# Patient Record
Sex: Female | Born: 1945 | Race: Black or African American | Hispanic: No | Smoking: Former smoker
Health system: Southern US, Community
[De-identification: ages and names within clinical notes are randomized; demographics above are authoritative.]

## PROBLEM LIST (undated history)

## (undated) DIAGNOSIS — M1990 Unspecified osteoarthritis, unspecified site: Secondary | ICD-10-CM

## (undated) DIAGNOSIS — E7800 Pure hypercholesterolemia, unspecified: Secondary | ICD-10-CM

## (undated) DIAGNOSIS — I10 Essential (primary) hypertension: Secondary | ICD-10-CM

## (undated) HISTORY — PX: ABDOMINAL HYSTERECTOMY: SHX81

---

## 2001-01-13 ENCOUNTER — Emergency Department (HOSPITAL_COMMUNITY): Admission: EM | Admit: 2001-01-13 | Discharge: 2001-01-13 | Payer: Self-pay | Admitting: Internal Medicine

## 2002-09-13 ENCOUNTER — Encounter: Payer: Self-pay | Admitting: Emergency Medicine

## 2002-09-13 ENCOUNTER — Emergency Department (HOSPITAL_COMMUNITY): Admission: EM | Admit: 2002-09-13 | Discharge: 2002-09-13 | Payer: Self-pay | Admitting: Emergency Medicine

## 2004-10-18 ENCOUNTER — Ambulatory Visit (HOSPITAL_COMMUNITY): Admission: RE | Admit: 2004-10-18 | Discharge: 2004-10-18 | Payer: Self-pay | Admitting: Internal Medicine

## 2006-05-27 ENCOUNTER — Ambulatory Visit (HOSPITAL_COMMUNITY): Admission: RE | Admit: 2006-05-27 | Discharge: 2006-05-27 | Payer: Self-pay | Admitting: Otolaryngology

## 2006-10-06 ENCOUNTER — Ambulatory Visit (HOSPITAL_COMMUNITY): Admission: RE | Admit: 2006-10-06 | Discharge: 2006-10-06 | Payer: Self-pay | Admitting: Internal Medicine

## 2007-10-26 ENCOUNTER — Ambulatory Visit (HOSPITAL_COMMUNITY): Admission: RE | Admit: 2007-10-26 | Discharge: 2007-10-26 | Payer: Self-pay | Admitting: Internal Medicine

## 2009-07-24 ENCOUNTER — Ambulatory Visit (HOSPITAL_COMMUNITY): Admission: RE | Admit: 2009-07-24 | Discharge: 2009-07-24 | Payer: Self-pay | Admitting: Internal Medicine

## 2014-09-25 DIAGNOSIS — E119 Type 2 diabetes mellitus without complications: Secondary | ICD-10-CM | POA: Diagnosis not present

## 2014-09-25 DIAGNOSIS — E782 Mixed hyperlipidemia: Secondary | ICD-10-CM | POA: Diagnosis not present

## 2014-09-25 DIAGNOSIS — I10 Essential (primary) hypertension: Secondary | ICD-10-CM | POA: Diagnosis not present

## 2014-09-25 DIAGNOSIS — N189 Chronic kidney disease, unspecified: Secondary | ICD-10-CM | POA: Diagnosis not present

## 2014-11-08 DIAGNOSIS — J4520 Mild intermittent asthma, uncomplicated: Secondary | ICD-10-CM | POA: Diagnosis not present

## 2014-11-08 DIAGNOSIS — Z1389 Encounter for screening for other disorder: Secondary | ICD-10-CM | POA: Diagnosis not present

## 2014-11-08 DIAGNOSIS — E119 Type 2 diabetes mellitus without complications: Secondary | ICD-10-CM | POA: Diagnosis not present

## 2014-11-08 DIAGNOSIS — Z0001 Encounter for general adult medical examination with abnormal findings: Secondary | ICD-10-CM | POA: Diagnosis not present

## 2014-11-08 DIAGNOSIS — E782 Mixed hyperlipidemia: Secondary | ICD-10-CM | POA: Diagnosis not present

## 2014-11-08 DIAGNOSIS — E870 Hyperosmolality and hypernatremia: Secondary | ICD-10-CM | POA: Diagnosis not present

## 2014-11-08 DIAGNOSIS — I10 Essential (primary) hypertension: Secondary | ICD-10-CM | POA: Diagnosis not present

## 2014-11-16 DIAGNOSIS — Z1231 Encounter for screening mammogram for malignant neoplasm of breast: Secondary | ICD-10-CM | POA: Diagnosis not present

## 2015-01-01 DIAGNOSIS — J4520 Mild intermittent asthma, uncomplicated: Secondary | ICD-10-CM | POA: Diagnosis not present

## 2015-01-01 DIAGNOSIS — E119 Type 2 diabetes mellitus without complications: Secondary | ICD-10-CM | POA: Diagnosis not present

## 2015-01-01 DIAGNOSIS — E782 Mixed hyperlipidemia: Secondary | ICD-10-CM | POA: Diagnosis not present

## 2015-01-01 DIAGNOSIS — M5416 Radiculopathy, lumbar region: Secondary | ICD-10-CM | POA: Diagnosis not present

## 2015-01-01 DIAGNOSIS — N189 Chronic kidney disease, unspecified: Secondary | ICD-10-CM | POA: Diagnosis not present

## 2015-01-01 DIAGNOSIS — I10 Essential (primary) hypertension: Secondary | ICD-10-CM | POA: Diagnosis not present

## 2015-01-05 DIAGNOSIS — M545 Low back pain: Secondary | ICD-10-CM | POA: Diagnosis not present

## 2015-01-05 DIAGNOSIS — M5410 Radiculopathy, site unspecified: Secondary | ICD-10-CM | POA: Diagnosis not present

## 2015-01-05 DIAGNOSIS — M6281 Muscle weakness (generalized): Secondary | ICD-10-CM | POA: Diagnosis not present

## 2015-01-05 DIAGNOSIS — M25552 Pain in left hip: Secondary | ICD-10-CM | POA: Diagnosis not present

## 2015-01-19 DIAGNOSIS — M5410 Radiculopathy, site unspecified: Secondary | ICD-10-CM | POA: Diagnosis not present

## 2015-01-19 DIAGNOSIS — M6281 Muscle weakness (generalized): Secondary | ICD-10-CM | POA: Diagnosis not present

## 2015-01-19 DIAGNOSIS — M545 Low back pain: Secondary | ICD-10-CM | POA: Diagnosis not present

## 2015-01-24 DIAGNOSIS — H5200 Hypermetropia, unspecified eye: Secondary | ICD-10-CM | POA: Diagnosis not present

## 2015-01-24 DIAGNOSIS — H5210 Myopia, unspecified eye: Secondary | ICD-10-CM | POA: Diagnosis not present

## 2015-01-24 DIAGNOSIS — H18419 Arcus senilis, unspecified eye: Secondary | ICD-10-CM | POA: Diagnosis not present

## 2015-01-24 DIAGNOSIS — I10 Essential (primary) hypertension: Secondary | ICD-10-CM | POA: Diagnosis not present

## 2015-02-02 DIAGNOSIS — M5410 Radiculopathy, site unspecified: Secondary | ICD-10-CM | POA: Diagnosis not present

## 2015-02-02 DIAGNOSIS — M545 Low back pain: Secondary | ICD-10-CM | POA: Diagnosis not present

## 2015-02-02 DIAGNOSIS — M6281 Muscle weakness (generalized): Secondary | ICD-10-CM | POA: Diagnosis not present

## 2015-02-15 DIAGNOSIS — M6281 Muscle weakness (generalized): Secondary | ICD-10-CM | POA: Diagnosis not present

## 2015-02-15 DIAGNOSIS — M5410 Radiculopathy, site unspecified: Secondary | ICD-10-CM | POA: Diagnosis not present

## 2015-02-15 DIAGNOSIS — M545 Low back pain: Secondary | ICD-10-CM | POA: Diagnosis not present

## 2015-02-15 DIAGNOSIS — M25552 Pain in left hip: Secondary | ICD-10-CM | POA: Diagnosis not present

## 2015-02-26 DIAGNOSIS — E119 Type 2 diabetes mellitus without complications: Secondary | ICD-10-CM | POA: Diagnosis not present

## 2015-02-26 DIAGNOSIS — E782 Mixed hyperlipidemia: Secondary | ICD-10-CM | POA: Diagnosis not present

## 2015-02-26 DIAGNOSIS — J4520 Mild intermittent asthma, uncomplicated: Secondary | ICD-10-CM | POA: Diagnosis not present

## 2015-02-26 DIAGNOSIS — M5416 Radiculopathy, lumbar region: Secondary | ICD-10-CM | POA: Diagnosis not present

## 2015-02-26 DIAGNOSIS — K30 Functional dyspepsia: Secondary | ICD-10-CM | POA: Diagnosis not present

## 2015-02-26 DIAGNOSIS — I10 Essential (primary) hypertension: Secondary | ICD-10-CM | POA: Diagnosis not present

## 2015-02-26 DIAGNOSIS — N189 Chronic kidney disease, unspecified: Secondary | ICD-10-CM | POA: Diagnosis not present

## 2015-03-02 DIAGNOSIS — M545 Low back pain: Secondary | ICD-10-CM | POA: Diagnosis not present

## 2015-03-02 DIAGNOSIS — M5410 Radiculopathy, site unspecified: Secondary | ICD-10-CM | POA: Diagnosis not present

## 2015-03-02 DIAGNOSIS — M6281 Muscle weakness (generalized): Secondary | ICD-10-CM | POA: Diagnosis not present

## 2015-03-02 DIAGNOSIS — M25552 Pain in left hip: Secondary | ICD-10-CM | POA: Diagnosis not present

## 2015-04-06 DIAGNOSIS — M5410 Radiculopathy, site unspecified: Secondary | ICD-10-CM | POA: Diagnosis not present

## 2015-04-06 DIAGNOSIS — M545 Low back pain: Secondary | ICD-10-CM | POA: Diagnosis not present

## 2015-04-06 DIAGNOSIS — M6281 Muscle weakness (generalized): Secondary | ICD-10-CM | POA: Diagnosis not present

## 2015-05-10 DIAGNOSIS — I10 Essential (primary) hypertension: Secondary | ICD-10-CM | POA: Diagnosis not present

## 2015-05-10 DIAGNOSIS — E870 Hyperosmolality and hypernatremia: Secondary | ICD-10-CM | POA: Diagnosis not present

## 2015-05-10 DIAGNOSIS — E782 Mixed hyperlipidemia: Secondary | ICD-10-CM | POA: Diagnosis not present

## 2015-05-10 DIAGNOSIS — E119 Type 2 diabetes mellitus without complications: Secondary | ICD-10-CM | POA: Diagnosis not present

## 2015-05-10 DIAGNOSIS — N189 Chronic kidney disease, unspecified: Secondary | ICD-10-CM | POA: Diagnosis not present

## 2015-05-17 DIAGNOSIS — E782 Mixed hyperlipidemia: Secondary | ICD-10-CM | POA: Diagnosis not present

## 2015-05-17 DIAGNOSIS — I10 Essential (primary) hypertension: Secondary | ICD-10-CM | POA: Diagnosis not present

## 2015-05-17 DIAGNOSIS — N183 Chronic kidney disease, stage 3 (moderate): Secondary | ICD-10-CM | POA: Diagnosis not present

## 2015-05-17 DIAGNOSIS — M5416 Radiculopathy, lumbar region: Secondary | ICD-10-CM | POA: Diagnosis not present

## 2015-05-17 DIAGNOSIS — E119 Type 2 diabetes mellitus without complications: Secondary | ICD-10-CM | POA: Diagnosis not present

## 2015-05-17 DIAGNOSIS — J4520 Mild intermittent asthma, uncomplicated: Secondary | ICD-10-CM | POA: Diagnosis not present

## 2015-05-17 DIAGNOSIS — E875 Hyperkalemia: Secondary | ICD-10-CM | POA: Diagnosis not present

## 2015-08-08 DIAGNOSIS — I10 Essential (primary) hypertension: Secondary | ICD-10-CM | POA: Diagnosis not present

## 2015-08-08 DIAGNOSIS — E119 Type 2 diabetes mellitus without complications: Secondary | ICD-10-CM | POA: Diagnosis not present

## 2015-08-08 DIAGNOSIS — E782 Mixed hyperlipidemia: Secondary | ICD-10-CM | POA: Diagnosis not present

## 2015-08-15 DIAGNOSIS — M5416 Radiculopathy, lumbar region: Secondary | ICD-10-CM | POA: Diagnosis not present

## 2015-08-15 DIAGNOSIS — J4520 Mild intermittent asthma, uncomplicated: Secondary | ICD-10-CM | POA: Diagnosis not present

## 2015-08-15 DIAGNOSIS — K30 Functional dyspepsia: Secondary | ICD-10-CM | POA: Diagnosis not present

## 2015-08-15 DIAGNOSIS — E875 Hyperkalemia: Secondary | ICD-10-CM | POA: Diagnosis not present

## 2015-08-15 DIAGNOSIS — N183 Chronic kidney disease, stage 3 (moderate): Secondary | ICD-10-CM | POA: Diagnosis not present

## 2015-08-15 DIAGNOSIS — E119 Type 2 diabetes mellitus without complications: Secondary | ICD-10-CM | POA: Diagnosis not present

## 2015-08-15 DIAGNOSIS — E782 Mixed hyperlipidemia: Secondary | ICD-10-CM | POA: Diagnosis not present

## 2015-08-15 DIAGNOSIS — E870 Hyperosmolality and hypernatremia: Secondary | ICD-10-CM | POA: Diagnosis not present

## 2015-08-15 DIAGNOSIS — I10 Essential (primary) hypertension: Secondary | ICD-10-CM | POA: Diagnosis not present

## 2015-09-20 DIAGNOSIS — I10 Essential (primary) hypertension: Secondary | ICD-10-CM | POA: Diagnosis not present

## 2015-09-20 DIAGNOSIS — E119 Type 2 diabetes mellitus without complications: Secondary | ICD-10-CM | POA: Diagnosis not present

## 2015-09-20 DIAGNOSIS — R05 Cough: Secondary | ICD-10-CM | POA: Diagnosis not present

## 2015-09-20 DIAGNOSIS — J019 Acute sinusitis, unspecified: Secondary | ICD-10-CM | POA: Diagnosis not present

## 2015-09-20 DIAGNOSIS — E782 Mixed hyperlipidemia: Secondary | ICD-10-CM | POA: Diagnosis not present

## 2015-09-20 DIAGNOSIS — N183 Chronic kidney disease, stage 3 (moderate): Secondary | ICD-10-CM | POA: Diagnosis not present

## 2016-02-15 DIAGNOSIS — E782 Mixed hyperlipidemia: Secondary | ICD-10-CM | POA: Diagnosis not present

## 2016-02-15 DIAGNOSIS — E870 Hyperosmolality and hypernatremia: Secondary | ICD-10-CM | POA: Diagnosis not present

## 2016-02-15 DIAGNOSIS — E119 Type 2 diabetes mellitus without complications: Secondary | ICD-10-CM | POA: Diagnosis not present

## 2016-02-15 DIAGNOSIS — E875 Hyperkalemia: Secondary | ICD-10-CM | POA: Diagnosis not present

## 2016-02-15 DIAGNOSIS — N183 Chronic kidney disease, stage 3 (moderate): Secondary | ICD-10-CM | POA: Diagnosis not present

## 2016-02-15 DIAGNOSIS — I10 Essential (primary) hypertension: Secondary | ICD-10-CM | POA: Diagnosis not present

## 2016-02-18 DIAGNOSIS — N183 Chronic kidney disease, stage 3 (moderate): Secondary | ICD-10-CM | POA: Diagnosis not present

## 2016-02-18 DIAGNOSIS — E875 Hyperkalemia: Secondary | ICD-10-CM | POA: Diagnosis not present

## 2016-02-18 DIAGNOSIS — E782 Mixed hyperlipidemia: Secondary | ICD-10-CM | POA: Diagnosis not present

## 2016-02-18 DIAGNOSIS — J4520 Mild intermittent asthma, uncomplicated: Secondary | ICD-10-CM | POA: Diagnosis not present

## 2016-02-18 DIAGNOSIS — Z0001 Encounter for general adult medical examination with abnormal findings: Secondary | ICD-10-CM | POA: Diagnosis not present

## 2016-02-18 DIAGNOSIS — M5416 Radiculopathy, lumbar region: Secondary | ICD-10-CM | POA: Diagnosis not present

## 2016-02-18 DIAGNOSIS — E119 Type 2 diabetes mellitus without complications: Secondary | ICD-10-CM | POA: Diagnosis not present

## 2016-02-18 DIAGNOSIS — I10 Essential (primary) hypertension: Secondary | ICD-10-CM | POA: Diagnosis not present

## 2016-04-21 DIAGNOSIS — L959 Vasculitis limited to the skin, unspecified: Secondary | ICD-10-CM | POA: Diagnosis not present

## 2016-04-21 DIAGNOSIS — E119 Type 2 diabetes mellitus without complications: Secondary | ICD-10-CM | POA: Diagnosis not present

## 2016-05-10 DIAGNOSIS — R21 Rash and other nonspecific skin eruption: Secondary | ICD-10-CM | POA: Diagnosis not present

## 2016-05-10 DIAGNOSIS — Z6841 Body Mass Index (BMI) 40.0 and over, adult: Secondary | ICD-10-CM | POA: Diagnosis not present

## 2016-06-03 DIAGNOSIS — H5200 Hypermetropia, unspecified eye: Secondary | ICD-10-CM | POA: Diagnosis not present

## 2016-06-03 DIAGNOSIS — Z0100 Encounter for examination of eyes and vision without abnormal findings: Secondary | ICD-10-CM | POA: Diagnosis not present

## 2016-06-03 DIAGNOSIS — H18413 Arcus senilis, bilateral: Secondary | ICD-10-CM | POA: Diagnosis not present

## 2016-06-10 DIAGNOSIS — Z1231 Encounter for screening mammogram for malignant neoplasm of breast: Secondary | ICD-10-CM | POA: Diagnosis not present

## 2016-06-19 DIAGNOSIS — H8110 Benign paroxysmal vertigo, unspecified ear: Secondary | ICD-10-CM | POA: Diagnosis not present

## 2016-06-19 DIAGNOSIS — L501 Idiopathic urticaria: Secondary | ICD-10-CM | POA: Diagnosis not present

## 2016-06-19 DIAGNOSIS — M5416 Radiculopathy, lumbar region: Secondary | ICD-10-CM | POA: Diagnosis not present

## 2016-06-19 DIAGNOSIS — R21 Rash and other nonspecific skin eruption: Secondary | ICD-10-CM | POA: Diagnosis not present

## 2016-06-19 DIAGNOSIS — N183 Chronic kidney disease, stage 3 (moderate): Secondary | ICD-10-CM | POA: Diagnosis not present

## 2016-06-19 DIAGNOSIS — J4520 Mild intermittent asthma, uncomplicated: Secondary | ICD-10-CM | POA: Diagnosis not present

## 2016-06-19 DIAGNOSIS — Z6841 Body Mass Index (BMI) 40.0 and over, adult: Secondary | ICD-10-CM | POA: Diagnosis not present

## 2016-06-19 DIAGNOSIS — E119 Type 2 diabetes mellitus without complications: Secondary | ICD-10-CM | POA: Diagnosis not present

## 2016-07-21 DIAGNOSIS — Z6841 Body Mass Index (BMI) 40.0 and over, adult: Secondary | ICD-10-CM | POA: Diagnosis not present

## 2016-07-21 DIAGNOSIS — M5416 Radiculopathy, lumbar region: Secondary | ICD-10-CM | POA: Diagnosis not present

## 2016-07-21 DIAGNOSIS — N183 Chronic kidney disease, stage 3 (moderate): Secondary | ICD-10-CM | POA: Diagnosis not present

## 2016-07-21 DIAGNOSIS — I10 Essential (primary) hypertension: Secondary | ICD-10-CM | POA: Diagnosis not present

## 2016-07-21 DIAGNOSIS — E119 Type 2 diabetes mellitus without complications: Secondary | ICD-10-CM | POA: Diagnosis not present

## 2016-07-29 ENCOUNTER — Encounter (INDEPENDENT_AMBULATORY_CARE_PROVIDER_SITE_OTHER): Payer: Self-pay

## 2016-07-29 ENCOUNTER — Encounter: Payer: Self-pay | Admitting: Allergy & Immunology

## 2016-07-29 ENCOUNTER — Ambulatory Visit (INDEPENDENT_AMBULATORY_CARE_PROVIDER_SITE_OTHER): Payer: Commercial Managed Care - HMO | Admitting: Allergy & Immunology

## 2016-07-29 VITALS — BP 122/86 | HR 97 | Temp 97.6°F | Ht 61.5 in | Wt 229.6 lb

## 2016-07-29 DIAGNOSIS — L508 Other urticaria: Secondary | ICD-10-CM | POA: Diagnosis not present

## 2016-07-29 DIAGNOSIS — J310 Chronic rhinitis: Secondary | ICD-10-CM | POA: Diagnosis not present

## 2016-07-29 DIAGNOSIS — T781XXD Other adverse food reactions, not elsewhere classified, subsequent encounter: Secondary | ICD-10-CM | POA: Diagnosis not present

## 2016-07-29 DIAGNOSIS — J309 Allergic rhinitis, unspecified: Secondary | ICD-10-CM | POA: Insufficient documentation

## 2016-07-29 DIAGNOSIS — J3089 Other allergic rhinitis: Secondary | ICD-10-CM | POA: Diagnosis not present

## 2016-07-29 DIAGNOSIS — J4520 Mild intermittent asthma, uncomplicated: Secondary | ICD-10-CM

## 2016-07-29 MED ORDER — MONTELUKAST SODIUM 10 MG PO TABS: 10.0000 mg | ORAL_TABLET | Freq: Every day | ORAL | 5 refills | Status: AC

## 2016-07-29 MED ORDER — FLUTICASONE PROPIONATE 50 MCG/ACT NA SUSP: 2.0000 | Freq: Every day | NASAL | 5 refills | Status: AC

## 2016-07-29 NOTE — Progress Notes (Addendum)
NEW PATIENT  Date of Service/Encounter:  07/29/16   Assessment:   Adverse food reaction (tomatoes, chocolate)  Chronic allergic rhinitis  Chronic urticaria   Asthma Reportables:  Severity: intermittent  Risk: low Control: well controlled  Seasonal Influenza Vaccine: yes    Plan/Recommendations:   1. Chronic urticaria - with concern for food trigger (total duration around 8 weeks) - Testing for tomatoes and chocolate was negative, therefore these are unlikely to be related to your hives. - We will send blood work to confirm that the testing was negative.  - We will get some labs to look for serious causes of hives: complete blood count, serum tryptase - There were no red flags in the history, including fevers, joint pains, or permanent skin changes. - Most of the time, we never figure out a cause of hives. - We start treatment with suppressive dosing of antihistamines: Allegra (fexofenadine) 1-2 tablets in the morning and Zyrtec (cetirizine) or Xyzal (levocetirizine) 1-2 tablets at night - If you are continuing to have hives with these medications or you cannot tolerate the side effects, we can discuss starting a monthly injectable medication for hives called Xolair.   2. Chronic rhinitis - Testing today showed: positives to dust mite and cockroach - Start Singulair 36m nightly and Flonase 1-2 sprays per nostril daily. - Avoidance measures discussed.   3. Intermittent asthma - Lung testing was normal today. - Continue with ProAir 4 puffs every 4-6 hours as needed for coughing/wheezing.  - The addition of the Singulair can help with asthma control as well.   4. Return in about 3 months (around 10/29/2016).    Subjective:   Allison Bates a 70y.o. female presenting today for evaluation of  Chief Complaint  Patient presents with  . New Evaluation    C/O hives. Possibly due to tomatoes. Pt states hives go away quickly.   .Marland Kitchen Allison Bates a  history of the following: Patient Active Problem List   Diagnosis Date Noted  . Allergic rhinitis due to allergen 07/29/2016  . Chronic urticaria 07/29/2016  . Mild intermittent asthma, uncomplicated [id number]/[id number]   History obtained from: chart review and patient.  Allison Bates referred by BCurlene Labrum MD.     Allison Bates a 70y.o. female presenting with concerns for food allergies. She reports today that she has had itching. This has started around 1-2 months ago. She had been eating tomatoes every day therefore she thinks this is related to the tomatoes or chocolate. The timing was not necessarily related to tomato ingestion (i.e. within minutes of eating the tomato). She has had urticaria development with the scratching. She stopped eating tomatoes around one month ago and it seems to have improved somewhat. She is using loratadine to treat the itching, which is providing relief. She has had no other systemic symptoms.She tolerates all of the major food allergens without adverse event. However, she does not eat many tree nuts.  Allison Bates have intermittent rhinorrhea. She does have occasional itchy watery eyes and nasal congestion. She takes Benadryl as needed for this. She has never been on a nasal steroid spray or an antihistamine nasal spray. There does not seem to be a seasonal predilection for the symptoms.   Allison Bates have asthma. This was diagnosed around age 70 It seemed to be fairly severe when she was younger with severe impairment of physical activity but it has since subsided in severity. She does  not have any current daily medications for this. She does have an albuterol inhaler to use when it "flies up". She estimates that she needs prednisone around 1-2 times per year. She does not cough at night. She denies ED visits for breathing. She has had spirometry in the past but does not remember any results of this.   Otherwise, there is no history of other  atopic diseases, including asthma, drug allergies, or stinging insect allergies. There is no significant infectious history. Vaccinations are up to date.    Past Medical History: Patient Active Problem List   Diagnosis Date Noted  . Allergic rhinitis due to allergen 07/29/2016  . Chronic urticaria 07/29/2016  . Mild intermittent asthma, uncomplicated 52/04/222    Medication List:    Medication List       Accurate as of 07/29/16 12:24 PM. Always use your most recent med list.          amLODipine 10 MG tablet Commonly known as:  NORVASC   atorvastatin 10 MG tablet Commonly known as:  LIPITOR daily.   valsartan-hydrochlorothiazide 320-25 MG tablet Commonly known as:  DIOVAN-HCT       Birth History: non-contributory. Born at term without complications.   Developmental History: Allison Bates has met all milestones on time. She has required no speech therapy, occupational therapy, or physical therapy.   Past Surgical History: Past Surgical History:  Procedure Laterality Date  . ABDOMINAL HYSTERECTOMY       Family History: Family History  Problem Relation Age of Onset  . Allergic rhinitis Neg Hx   . Angioedema Neg Hx   . Asthma Neg Hx   . Atopy Neg Hx   . Eczema Neg Hx   . Immunodeficiency Neg Hx   . Urticaria Neg Hx      Social History: Allison Bates lives at home with her husband of 85 years. They live in a 47 year old home. There is no mildew allergic problems in the home. They have electric heating and central cooling. There are no animals inside the home but there is one dog outside of the home. There are no dust mite covers on the bed with pillows. She was a previous smoker and around 16 years ago. Allison Bates has had several jobs over the course of her lifetime. She worked in a Clinical cytogeneticist for 37 years and then at [employer] for 12 years. 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   Review of Systems: a 14-point  review of systems is pertinent for what is mentioned in HPI.  Otherwise, all other systems were negative. Constitutional: negative other than that listed in the HPI Eyes: negative other than that listed in the HPI Ears, nose, mouth, throat, and face: negative other than that listed in the HPI Respiratory: negative other than that listed in the HPI Cardiovascular: negative other than that listed in the HPI Gastrointestinal: negative other than that listed in the HPI Genitourinary: negative other than that listed in the HPI Integument: negative other than that listed in the HPI Hematologic: negative other than that listed in the HPI Musculoskeletal: negative other than that listed in the HPI Neurological: negative other than that listed in the HPI Allergy/Immunologic: negative other than that listed in the HPI    Objective:   Blood pressure 122/86, pulse 97, temperature 97.6 F (36.4 C), temperature source Oral, height 5' 1.5" (1.562 m), weight 229 lb 9.6 oz (104.1 kg), SpO2 94 %. Body mass index is 42.68  kg/m.   Physical Exam:  General: Alert, interactive, in no acute distress. Very pleasant and smiling during the exam. HEENT: TMs pearly gray, turbinates markedly edematous and pale (left > right) with clear discharge, post-pharynx erythematous. Neck: Supple without thyromegaly. Adenopathy: no enlarged lymph nodes appreciated in the anterior cervical, occipital, axillary, epitrochlear, inguinal, or popliteal regions Lungs: Clear to auscultation without wheezing, rhonchi or rales. No increased work of breathing. CV: Normal S1/S2, no murmurs. Capillary refill <2 seconds.  Abdomen: Nondistended, nontender. No guarding or rebound tenderness. Bowel sounds faint and present in all fields  Skin: Warm and dry, without lesions or rashes. No dermatographism present. Extremities:  No clubbing, cyanosis or edema. Neuro:   Grossly intact. No focal deficits noted.  Diagnostic studies:    Spirometry: results normal (FEV1: 1.14/76%, FVC: 1.60/88%, FEV1/FVC: 71%).    Spirometry consistent with normal pattern.    Allergy Studies:   Indoor/Outdoor Percutaneous Adult Environmental Panel: positive to dust mites and cockroach with adequate controls  Selected Foods Panel: negative to chocolate and tomato with adequate controls    Salvatore Marvel, MD [place] [facility] [place]

## 2016-07-29 NOTE — Patient Instructions (Addendum)
1. Chronic urticaria - with concern for food trigger - Testing for tomatoes and chocolate was negative, therefore these are unlikely to be related to your hives. - We will send blood work to confirm that the testing was negative.  - We will get some labs to look for serious causes of hives: complete blood count, serum tryptase - Most of the time, we never figure out a cause of hives. - We start treatment with suppressive dosing of antihistamines: Allegra (fexofenadine) 1-2 tablets in the morning and Zyrtec (cetirizine) or Xyzal (levocetirizine) 1-2 tablets at night - If you are continuing to have hives with these medications or you cannot tolerate the side effects, we can discuss starting a monthly injectable medication for hives called Xolair.   2. Chronic rhinitis - Testing today showed: positives to dust mite and cockroach - Start Singulair 10mg  nightly and Flonase 1-2 sprays per nostril daily. - Avoidance measures discussed.   3. Intermittent asthma - Lung testing was normal today. - Continue with ProAir 4 puffs every 4-6 hours as needed for coughing/wheezing.  - The addition of the Singulair can help with asthma control as well.   4. Return in about 3 months (around 10/29/2016).  Please inform us of any Emergency Department visits, hospitalizations, or changes in symptoms. Call us before going to the ED for breathing or allergy symptoms since we might be able to fit you in for a sick visit. Feel free to contact us anytime with any questions, problems, or concerns.  It was a pleasure to meet you today! Have a wonderful Thanksgiving holiday!   Websites that have reliable patient information: 1. American Academy of Asthma, Allergy, and Immunology: www.aaaai.org 2. Food Allergy Research and Education (FARE): foodallergy.org 3. Mothers of Asthmatics: http://www.asthmacommunitynetwork.org 4. American College of Allergy, Asthma, and Immunology: www.acaai.org  Control of House Dust Mite  Allergen    House dust mites play a major role in allergic asthma and rhinitis.  They occur in environments with high humidity wherever human skin, the food for dust mites is found. High levels have been detected in dust obtained from mattresses, pillows, carpets, upholstered furniture, bed covers, clothes and soft toys.  The principal allergen of the house dust mite is found in its feces.  A gram of dust may contain 1,000 mites and 250,000 fecal particles.  Mite antigen is easily measured in the air during house cleaning activities.    1. Encase mattresses, including the box spring, and pillow, in an air tight cover.  Seal the zipper end of the encased mattresses with wide adhesive tape. 2. Wash the bedding in water of 130 degrees Farenheit weekly.  Avoid cotton comforters/quilts and flannel bedding: the most ideal bed covering is the dacron comforter. 3. Remove all upholstered furniture from the bedroom. 4. Remove carpets, carpet padding, rugs, and non-washable window drapes from the bedroom.  Wash drapes weekly or use plastic window coverings. 5. Remove all non-washable stuffed toys from the bedroom.  Wash stuffed toys weekly. 6. Have the room cleaned frequently with a vacuum cleaner and a damp dust-mop.  The patient should not be in a room which is being cleaned and should wait 1 hour after cleaning before going into the room. 7. Close and seal all heating outlets in the bedroom.  Otherwise, the room will become filled with dust-laden air.  An electric heater can be used to heat the room. 8. Reduce indoor humidity to less than 50%.  Do not use a humidifier.  Control of  Cockroach Allergen  Cockroach allergen has been identified as an important cause of acute attacks of asthma, especially in urban settings.  There are fifty-five species of cockroach that exist in the [place], however only three, the [place], [place] and Oriental species produce allergen that can affect patients with Asthma.   Allergens can be obtained from fecal particles, egg casings and secretions from cockroaches.    1. Remove food sources. 2. Reduce access to water. 3. Seal access and entry points. 4. Spray runways with 0.5-1% Diazinon or Chlorpyrifos 5. Blow boric acid power under stoves and refrigerator. 6. Place bait stations (hydramethylnon) at feeding sites.

## 2016-08-18 DIAGNOSIS — I10 Essential (primary) hypertension: Secondary | ICD-10-CM | POA: Diagnosis not present

## 2016-08-18 DIAGNOSIS — E870 Hyperosmolality and hypernatremia: Secondary | ICD-10-CM | POA: Diagnosis not present

## 2016-08-18 DIAGNOSIS — E119 Type 2 diabetes mellitus without complications: Secondary | ICD-10-CM | POA: Diagnosis not present

## 2016-08-18 DIAGNOSIS — N183 Chronic kidney disease, stage 3 (moderate): Secondary | ICD-10-CM | POA: Diagnosis not present

## 2016-08-18 DIAGNOSIS — E782 Mixed hyperlipidemia: Secondary | ICD-10-CM | POA: Diagnosis not present

## 2016-08-18 DIAGNOSIS — E875 Hyperkalemia: Secondary | ICD-10-CM | POA: Diagnosis not present

## 2016-08-22 DIAGNOSIS — N183 Chronic kidney disease, stage 3 (moderate): Secondary | ICD-10-CM | POA: Diagnosis not present

## 2016-08-22 DIAGNOSIS — E119 Type 2 diabetes mellitus without complications: Secondary | ICD-10-CM | POA: Diagnosis not present

## 2016-08-22 DIAGNOSIS — M5416 Radiculopathy, lumbar region: Secondary | ICD-10-CM | POA: Diagnosis not present

## 2016-08-22 DIAGNOSIS — I10 Essential (primary) hypertension: Secondary | ICD-10-CM | POA: Diagnosis not present

## 2016-08-22 DIAGNOSIS — J4520 Mild intermittent asthma, uncomplicated: Secondary | ICD-10-CM | POA: Diagnosis not present

## 2016-08-22 DIAGNOSIS — E782 Mixed hyperlipidemia: Secondary | ICD-10-CM | POA: Diagnosis not present

## 2016-08-22 DIAGNOSIS — E870 Hyperosmolality and hypernatremia: Secondary | ICD-10-CM | POA: Diagnosis not present

## 2016-08-22 DIAGNOSIS — L501 Idiopathic urticaria: Secondary | ICD-10-CM | POA: Diagnosis not present

## 2016-09-16 ENCOUNTER — Encounter (HOSPITAL_COMMUNITY): Payer: Self-pay | Admitting: Emergency Medicine

## 2016-09-16 ENCOUNTER — Emergency Department (HOSPITAL_COMMUNITY)
Admission: EM | Admit: 2016-09-16 | Discharge: 2016-09-16 | Disposition: A | Discharge status: Home / Self Care | Payer: Medicare HMO | Attending: Emergency Medicine | Admitting: Emergency Medicine

## 2016-09-16 ENCOUNTER — Emergency Department (HOSPITAL_COMMUNITY): Payer: Medicare HMO

## 2016-09-16 DIAGNOSIS — J4520 Mild intermittent asthma, uncomplicated: Secondary | ICD-10-CM | POA: Insufficient documentation

## 2016-09-16 DIAGNOSIS — M7732 Calcaneal spur, left foot: Secondary | ICD-10-CM | POA: Diagnosis not present

## 2016-09-16 DIAGNOSIS — I10 Essential (primary) hypertension: Secondary | ICD-10-CM | POA: Diagnosis not present

## 2016-09-16 DIAGNOSIS — Z79899 Other long term (current) drug therapy: Secondary | ICD-10-CM | POA: Insufficient documentation

## 2016-09-16 DIAGNOSIS — M722 Plantar fascial fibromatosis: Secondary | ICD-10-CM | POA: Insufficient documentation

## 2016-09-16 DIAGNOSIS — M79672 Pain in left foot: Secondary | ICD-10-CM | POA: Diagnosis present

## 2016-09-16 DIAGNOSIS — Z87891 Personal history of nicotine dependence: Secondary | ICD-10-CM | POA: Insufficient documentation

## 2016-09-16 HISTORY — DX: Pure hypercholesterolemia, unspecified: E78.00

## 2016-09-16 HISTORY — DX: Essential (primary) hypertension: I10

## 2016-09-16 MED ORDER — IBUPROFEN 400 MG PO TABS: 400.0000 mg | ORAL_TABLET | Freq: Four times a day (QID) | ORAL | 0 refills | Status: DC

## 2016-09-16 MED ORDER — HYDROCODONE-ACETAMINOPHEN 5-325 MG PO TABS: 1.0000 | ORAL_TABLET | ORAL | 0 refills | Status: AC | PRN

## 2016-09-16 MED ORDER — IBUPROFEN 400 MG PO TABS
400.0000 mg | ORAL_TABLET | Freq: Once | ORAL | Status: AC
  Administered 2016-09-16: 400 mg via ORAL
  Filled 2016-09-16: qty 1

## 2016-09-16 MED ORDER — HYDROCODONE-ACETAMINOPHEN 5-325 MG PO TABS: 1.0000 | ORAL_TABLET | Freq: Once | ORAL | Status: DC

## 2016-09-16 NOTE — ED Triage Notes (Signed)
Patient complaining of pain to bottom of left foot x 1 week. Denies injury. Patient ambulatory at triage.

## 2016-09-16 NOTE — Discharge Instructions (Signed)
Your examination and x-ray suggest plantar fasciitis of your left foot. Please do the stretching exercises listed in your discharge instructions several times during the day. Please use ibuprofen with breakfast, lunch, dinner, and at bedtime. Use Norco for more severe pain. Norco may cause drowsiness, and/or lightheadedness. Please use this medication with caution. Please see Dr. Nolen MuMcKinney, podiatry specialist, if not improving.

## 2016-09-16 NOTE — ED Provider Notes (Addendum)
[facility]-EMER[facility]ENCY DEPT Provider Note   CSN: [id number] Arrival date & time: 09/16/16  1015     History   Chief Complaint Chief Complaint  Patient presents with  . Foot Pain    HPI Allison Bates is a 71 y.o. female.  Patient is a 71 year old female who presents to the emergency department with a complaint of pain of the left foot.  The patient states she's been having problems with the bottom of her feet for the past week. The pain is worse early in the mornings when she gets up, and does improve some after she walks around some during the day. Today the pain was particularly bad she came to the emergency department for evaluation. She does not recall any injury or trauma to her feet. She has not had any recent operations or procedures involving her feet. She does not recall stepping on any sharp objects. She presents now for assistance with this pain and discomfort.    Foot Pain  Pertinent negatives include no chest pain, no abdominal pain and no shortness of breath.    Past Medical History:  Diagnosis Date  . High cholesterol   . Hypertension     Patient Active Problem List   Diagnosis Date Noted  . Allergic rhinitis due to allergen 07/29/2016  . Chronic urticaria 07/29/2016  . Mild intermittent asthma, uncomplicated 07/29/2016    Past Surgical History:  Procedure Laterality Date  . ABDOMINAL HYSTERECTOMY      OB History    No data available       Home Medications    Prior to Admission medications   Medication Sig Start Date End Date Taking? Authorizing Provider  amLODipine (NORVASC) 10 MG tablet Take 10 mg by mouth daily.  07/16/16  Yes Historical Provider, MD  atorvastatin (LIPITOR) 10 MG tablet daily. 12/22/13  Yes Historical Provider, MD  fluticasone (FLONASE) 50 MCG/ACT nasal spray Place 2 sprays into both nostrils daily. 07/29/16  Yes Alfonse Spruce, MD  montelukast (SINGULAIR) 10 MG tablet Take 1 tablet (10 mg total) by mouth at bedtime.  07/29/16  Yes Alfonse Spruce, MD  valsartan-hydrochlorothiazide (DIOVAN-HCT) 320-25 MG tablet Take 1 tablet by mouth daily.  06/14/16  Yes Historical Provider, MD    Family History Family History  Problem Relation Age of Onset  . Allergic rhinitis Neg Hx   . Angioedema Neg Hx   . Asthma Neg Hx   . Atopy Neg Hx   . Eczema Neg Hx   . Immunodeficiency Neg Hx   . Urticaria Neg Hx     Social History Social History  Substance Use Topics  . Smoking status: Former Games developer  . Smokeless tobacco: Never Used  . Alcohol use No     Allergies   Patient has no known allergies.   Review of Systems Review of Systems  Constitutional: Negative for activity change.       All ROS Neg except as noted in HPI  HENT: Negative for nosebleeds.   Eyes: Negative for photophobia and discharge.  Respiratory: Negative for cough, shortness of breath and wheezing.   Cardiovascular: Negative for chest pain and palpitations.  Gastrointestinal: Negative for abdominal pain and blood in stool.  Genitourinary: Negative for dysuria, frequency and hematuria.  Musculoskeletal: Negative for arthralgias, back pain and neck pain.       Foot pain  Skin: Negative.   Neurological: Negative for dizziness, seizures and speech difficulty.  Psychiatric/Behavioral: Negative for confusion and hallucinations.  Physical Exam Updated Vital Signs BP 128/74 (BP Location: Right Arm)   Pulse 103   Temp 98.1 F (36.7 C) (Oral)   Resp 16   Ht 5\' 2"  (1.575 m)   Wt 106.1 kg   SpO2 95%   BMI 42.80 kg/m   Physical Exam  Constitutional: She is oriented to person, place, and time. She appears well-developed and well-nourished.  Non-toxic appearance.  HENT:  Head: Normocephalic.  Right Ear: Tympanic membrane and external ear normal.  Left Ear: Tympanic membrane and external ear normal.  Eyes: EOM and lids are normal. Pupils are equal, round, and reactive to light.  Neck: Normal range of motion. Neck supple.  Carotid bruit is not present.  Cardiovascular: Normal rate, regular rhythm, normal heart sounds, intact distal pulses and normal pulses.   Pulmonary/Chest: Breath sounds normal. No respiratory distress.  Abdominal: Soft. Bowel sounds are normal. There is no tenderness. There is no guarding.  Musculoskeletal: Normal range of motion.  There is good range of motion of the left hip and knee. There is some crepitus with attempted range of motion of the knee. Is no effusion appreciated at this time. There no temperature changes of the lower extremity. The Achilles tendon is intact bilaterally. There is pain to the plantar surface of the left foot. There is no evidence of puncture wound present. The lesions noted between the toes. Pain reproduced on stretching exercises of the plantar fascia.  Lymphadenopathy:       Head (right side): No submandibular adenopathy present.       Head (left side): No submandibular adenopathy present.    She has no cervical adenopathy.  Neurological: She is alert and oriented to person, place, and time. She has normal strength. No cranial nerve deficit or sensory deficit.  Skin: Skin is warm and dry.  Psychiatric: She has a normal mood and affect. Her speech is normal.  Nursing note and vitals reviewed.    ED Treatments / Results  Labs (all labs ordered are listed, but only abnormal results are displayed) Labs Reviewed - No data to display  EKG  EKG Interpretation None       Radiology Dg Foot Complete Left  Result Date: 09/16/2016 CLINICAL DATA:  Left foot pain for 1 week, no known injury EXAM: LEFT FOOT - COMPLETE 3+ VIEW COMPARISON:  None FINDINGS: Three views of the left foot submitted. No acute fracture or subluxation. There is plantar and posterior spurring of calcaneus. IMPRESSION: No acute fracture or subluxation. Plantar and posterior spurring of calcaneus. Electronically Signed   By: Natasha MeadLiviu  Pop M.D.   On: 09/16/2016 11:19    Procedures Procedures  (including critical care time)  Medications Ordered in ED Medications  HYDROcodone-acetaminophen (NORCO/VICODIN) 5-325 MG per tablet 1 tablet (not administered)  ibuprofen (ADVIL,MOTRIN) tablet 400 mg (not administered)     Initial Impression / Assessment and Plan / ED Course  I have reviewed the triage vital signs and the nursing notes.  Pertinent labs & imaging results that were available during my care of the patient were reviewed by me and considered in my medical decision making (see chart for details).  Clinical Course     **I have reviewed nursing notes, vital signs, and all appropriate lab and imaging results for this patient.*  Final Clinical Impressions(s) / ED Diagnoses  MDM X-ray of the left foot is negative for fracture, dislocation, or foreign body. There is noted posterior and anterior calcaneal spurs. The examination favors plantar fasciitis. The patient  has full range of motion of the toes. There is no deformity or problem with the Achilles tendon.  Plan at this time will be for anti-inflammatory medication and pain medication. Stretching. Patient is referred to podiatry for additional evaluation if not improving.    Final diagnoses:  Plantar fasciitis of left foot    New Prescriptions New Prescriptions   No medications on file     Ivery Quale, Cordelia Poche 09/16/16 1319    Raeford Razor, MD 09/19/16 1446    Ivery Quale, PA-C 10/06/16 0865    Raeford Razor, MD 10/13/16 [phone]

## 2016-10-14 DIAGNOSIS — T781XXD Other adverse food reactions, not elsewhere classified, subsequent encounter: Secondary | ICD-10-CM | POA: Diagnosis not present

## 2016-10-14 LAB — CBC WITH DIFFERENTIAL/PLATELET
BASOS ABS: 0 {cells}/uL (ref 0–200)
Basophils Relative: 0 %
EOS ABS: 949 {cells}/uL — AB (ref 15–500)
EOS PCT: 13 %
HCT: 37.9 % (ref 35.0–45.0)
HEMOGLOBIN: 12.4 g/dL (ref 11.7–15.5)
LYMPHS ABS: 2628 {cells}/uL (ref 850–3900)
Lymphocytes Relative: 36 %
MCH: 31.2 pg (ref 27.0–33.0)
MCHC: 32.7 g/dL (ref 32.0–36.0)
MCV: 95.2 fL (ref 80.0–100.0)
MPV: 10.7 fL (ref 7.5–12.5)
Monocytes Absolute: 511 cells/uL (ref 200–950)
Monocytes Relative: 7 %
NEUTROS ABS: 3212 {cells}/uL (ref 1500–7800)
Neutrophils Relative %: 44 %
Platelets: 323 10*3/uL (ref 140–400)
RBC: 3.98 MIL/uL (ref 3.80–5.10)
RDW: 15.5 % — ABNORMAL HIGH (ref 11.0–15.0)
WBC: 7.3 10*3/uL (ref 3.8–10.8)

## 2016-10-15 LAB — ALLERGEN CHOCOLATE: Allergen Chocolate f93: <0.1 kU/L

## 2016-10-15 LAB — ALLERGEN, TOMATO F25: Tomato IgE: 0.11 kU/L — ABNORMAL HIGH

## 2016-10-17 LAB — TRYPTASE: TRYPTASE: 10.1 ug/L (ref <11)

## 2016-10-20 ENCOUNTER — Other Ambulatory Visit: Payer: Self-pay

## 2016-10-20 MED ORDER — EPINEPHRINE 0.3 MG/0.3ML IJ SOAJ: 0.3000 mg | Freq: Once | INTRAMUSCULAR | 1 refills | Status: AC

## 2016-10-28 ENCOUNTER — Ambulatory Visit: Payer: Commercial Managed Care - HMO | Admitting: Allergy & Immunology

## 2016-12-08 DIAGNOSIS — M1712 Unilateral primary osteoarthritis, left knee: Secondary | ICD-10-CM | POA: Diagnosis not present

## 2016-12-08 DIAGNOSIS — M19012 Primary osteoarthritis, left shoulder: Secondary | ICD-10-CM | POA: Diagnosis not present

## 2016-12-08 DIAGNOSIS — Z6841 Body Mass Index (BMI) 40.0 and over, adult: Secondary | ICD-10-CM | POA: Diagnosis not present

## 2016-12-08 DIAGNOSIS — N183 Chronic kidney disease, stage 3 (moderate): Secondary | ICD-10-CM | POA: Diagnosis not present

## 2017-02-19 DIAGNOSIS — E875 Hyperkalemia: Secondary | ICD-10-CM | POA: Diagnosis not present

## 2017-02-19 DIAGNOSIS — I10 Essential (primary) hypertension: Secondary | ICD-10-CM | POA: Diagnosis not present

## 2017-02-19 DIAGNOSIS — E870 Hyperosmolality and hypernatremia: Secondary | ICD-10-CM | POA: Diagnosis not present

## 2017-02-19 DIAGNOSIS — N183 Chronic kidney disease, stage 3 (moderate): Secondary | ICD-10-CM | POA: Diagnosis not present

## 2017-02-19 DIAGNOSIS — E782 Mixed hyperlipidemia: Secondary | ICD-10-CM | POA: Diagnosis not present

## 2017-02-19 DIAGNOSIS — E119 Type 2 diabetes mellitus without complications: Secondary | ICD-10-CM | POA: Diagnosis not present

## 2017-02-23 DIAGNOSIS — Z0001 Encounter for general adult medical examination with abnormal findings: Secondary | ICD-10-CM | POA: Diagnosis not present

## 2017-02-23 DIAGNOSIS — M1712 Unilateral primary osteoarthritis, left knee: Secondary | ICD-10-CM | POA: Diagnosis not present

## 2017-02-23 DIAGNOSIS — E119 Type 2 diabetes mellitus without complications: Secondary | ICD-10-CM | POA: Diagnosis not present

## 2017-02-23 DIAGNOSIS — Z6841 Body Mass Index (BMI) 40.0 and over, adult: Secondary | ICD-10-CM | POA: Diagnosis not present

## 2017-02-23 DIAGNOSIS — E870 Hyperosmolality and hypernatremia: Secondary | ICD-10-CM | POA: Diagnosis not present

## 2017-02-23 DIAGNOSIS — I10 Essential (primary) hypertension: Secondary | ICD-10-CM | POA: Diagnosis not present

## 2017-02-23 DIAGNOSIS — E782 Mixed hyperlipidemia: Secondary | ICD-10-CM | POA: Diagnosis not present

## 2017-02-23 DIAGNOSIS — M5416 Radiculopathy, lumbar region: Secondary | ICD-10-CM | POA: Diagnosis not present

## 2017-08-20 DIAGNOSIS — E875 Hyperkalemia: Secondary | ICD-10-CM | POA: Diagnosis not present

## 2017-08-20 DIAGNOSIS — N183 Chronic kidney disease, stage 3 (moderate): Secondary | ICD-10-CM | POA: Diagnosis not present

## 2017-08-20 DIAGNOSIS — E870 Hyperosmolality and hypernatremia: Secondary | ICD-10-CM | POA: Diagnosis not present

## 2017-08-20 DIAGNOSIS — E119 Type 2 diabetes mellitus without complications: Secondary | ICD-10-CM | POA: Diagnosis not present

## 2017-08-20 DIAGNOSIS — I10 Essential (primary) hypertension: Secondary | ICD-10-CM | POA: Diagnosis not present

## 2017-08-20 DIAGNOSIS — E782 Mixed hyperlipidemia: Secondary | ICD-10-CM | POA: Diagnosis not present

## 2017-08-24 DIAGNOSIS — I10 Essential (primary) hypertension: Secondary | ICD-10-CM | POA: Diagnosis not present

## 2017-08-24 DIAGNOSIS — E119 Type 2 diabetes mellitus without complications: Secondary | ICD-10-CM | POA: Diagnosis not present

## 2017-08-24 DIAGNOSIS — E870 Hyperosmolality and hypernatremia: Secondary | ICD-10-CM | POA: Diagnosis not present

## 2017-08-24 DIAGNOSIS — M19012 Primary osteoarthritis, left shoulder: Secondary | ICD-10-CM | POA: Diagnosis not present

## 2017-08-24 DIAGNOSIS — J4520 Mild intermittent asthma, uncomplicated: Secondary | ICD-10-CM | POA: Diagnosis not present

## 2017-08-24 DIAGNOSIS — E782 Mixed hyperlipidemia: Secondary | ICD-10-CM | POA: Diagnosis not present

## 2017-08-24 DIAGNOSIS — M5416 Radiculopathy, lumbar region: Secondary | ICD-10-CM | POA: Diagnosis not present

## 2017-08-24 DIAGNOSIS — Z6841 Body Mass Index (BMI) 40.0 and over, adult: Secondary | ICD-10-CM | POA: Diagnosis not present

## 2017-09-07 DIAGNOSIS — E119 Type 2 diabetes mellitus without complications: Secondary | ICD-10-CM | POA: Diagnosis not present

## 2017-09-07 DIAGNOSIS — E782 Mixed hyperlipidemia: Secondary | ICD-10-CM | POA: Diagnosis not present

## 2017-09-07 DIAGNOSIS — Z6841 Body Mass Index (BMI) 40.0 and over, adult: Secondary | ICD-10-CM | POA: Diagnosis not present

## 2017-09-07 DIAGNOSIS — J4520 Mild intermittent asthma, uncomplicated: Secondary | ICD-10-CM | POA: Diagnosis not present

## 2017-09-07 DIAGNOSIS — I10 Essential (primary) hypertension: Secondary | ICD-10-CM | POA: Diagnosis not present

## 2017-09-07 DIAGNOSIS — H6121 Impacted cerumen, right ear: Secondary | ICD-10-CM | POA: Diagnosis not present

## 2017-09-07 DIAGNOSIS — H8110 Benign paroxysmal vertigo, unspecified ear: Secondary | ICD-10-CM | POA: Diagnosis not present

## 2017-09-07 DIAGNOSIS — N183 Chronic kidney disease, stage 3 (moderate): Secondary | ICD-10-CM | POA: Diagnosis not present

## 2017-12-14 ENCOUNTER — Other Ambulatory Visit (HOSPITAL_COMMUNITY): Payer: Self-pay | Admitting: Family Medicine

## 2017-12-14 DIAGNOSIS — Z1231 Encounter for screening mammogram for malignant neoplasm of breast: Secondary | ICD-10-CM

## 2017-12-17 ENCOUNTER — Ambulatory Visit (HOSPITAL_COMMUNITY)
Admission: RE | Admit: 2017-12-17 | Discharge: 2017-12-17 | Disposition: A | Discharge status: Home / Self Care | Payer: Medicare HMO | Source: Ambulatory Visit | Attending: Family Medicine | Admitting: Family Medicine

## 2017-12-17 ENCOUNTER — Encounter (HOSPITAL_COMMUNITY): Payer: Self-pay

## 2017-12-17 DIAGNOSIS — Z1231 Encounter for screening mammogram for malignant neoplasm of breast: Secondary | ICD-10-CM | POA: Diagnosis not present

## 2018-02-11 DIAGNOSIS — H5200 Hypermetropia, unspecified eye: Secondary | ICD-10-CM | POA: Diagnosis not present

## 2018-02-11 DIAGNOSIS — H18413 Arcus senilis, bilateral: Secondary | ICD-10-CM | POA: Diagnosis not present

## 2018-02-17 DIAGNOSIS — Z0100 Encounter for examination of eyes and vision without abnormal findings: Secondary | ICD-10-CM | POA: Diagnosis not present

## 2018-02-24 DIAGNOSIS — N183 Chronic kidney disease, stage 3 (moderate): Secondary | ICD-10-CM | POA: Diagnosis not present

## 2018-02-24 DIAGNOSIS — E119 Type 2 diabetes mellitus without complications: Secondary | ICD-10-CM | POA: Diagnosis not present

## 2018-02-24 DIAGNOSIS — I10 Essential (primary) hypertension: Secondary | ICD-10-CM | POA: Diagnosis not present

## 2018-02-24 DIAGNOSIS — M19012 Primary osteoarthritis, left shoulder: Secondary | ICD-10-CM | POA: Diagnosis not present

## 2018-02-24 DIAGNOSIS — E875 Hyperkalemia: Secondary | ICD-10-CM | POA: Diagnosis not present

## 2018-02-24 DIAGNOSIS — E782 Mixed hyperlipidemia: Secondary | ICD-10-CM | POA: Diagnosis not present

## 2018-02-24 DIAGNOSIS — E870 Hyperosmolality and hypernatremia: Secondary | ICD-10-CM | POA: Diagnosis not present

## 2018-03-02 DIAGNOSIS — I10 Essential (primary) hypertension: Secondary | ICD-10-CM | POA: Diagnosis not present

## 2018-03-02 DIAGNOSIS — L501 Idiopathic urticaria: Secondary | ICD-10-CM | POA: Diagnosis not present

## 2018-03-02 DIAGNOSIS — Z6841 Body Mass Index (BMI) 40.0 and over, adult: Secondary | ICD-10-CM | POA: Diagnosis not present

## 2018-03-02 DIAGNOSIS — E870 Hyperosmolality and hypernatremia: Secondary | ICD-10-CM | POA: Diagnosis not present

## 2018-03-02 DIAGNOSIS — Z0001 Encounter for general adult medical examination with abnormal findings: Secondary | ICD-10-CM | POA: Diagnosis not present

## 2018-03-02 DIAGNOSIS — E119 Type 2 diabetes mellitus without complications: Secondary | ICD-10-CM | POA: Diagnosis not present

## 2018-03-02 DIAGNOSIS — E782 Mixed hyperlipidemia: Secondary | ICD-10-CM | POA: Diagnosis not present

## 2018-03-02 DIAGNOSIS — J4520 Mild intermittent asthma, uncomplicated: Secondary | ICD-10-CM | POA: Diagnosis not present

## 2018-08-17 DIAGNOSIS — E782 Mixed hyperlipidemia: Secondary | ICD-10-CM | POA: Diagnosis not present

## 2018-08-17 DIAGNOSIS — E870 Hyperosmolality and hypernatremia: Secondary | ICD-10-CM | POA: Diagnosis not present

## 2018-08-17 DIAGNOSIS — N183 Chronic kidney disease, stage 3 (moderate): Secondary | ICD-10-CM | POA: Diagnosis not present

## 2018-08-17 DIAGNOSIS — E875 Hyperkalemia: Secondary | ICD-10-CM | POA: Diagnosis not present

## 2018-08-17 DIAGNOSIS — E119 Type 2 diabetes mellitus without complications: Secondary | ICD-10-CM | POA: Diagnosis not present

## 2018-08-17 DIAGNOSIS — I10 Essential (primary) hypertension: Secondary | ICD-10-CM | POA: Diagnosis not present

## 2018-08-23 DIAGNOSIS — E1122 Type 2 diabetes mellitus with diabetic chronic kidney disease: Secondary | ICD-10-CM | POA: Diagnosis not present

## 2018-08-23 DIAGNOSIS — N182 Chronic kidney disease, stage 2 (mild): Secondary | ICD-10-CM | POA: Diagnosis not present

## 2018-08-23 DIAGNOSIS — J4520 Mild intermittent asthma, uncomplicated: Secondary | ICD-10-CM | POA: Diagnosis not present

## 2018-08-23 DIAGNOSIS — E870 Hyperosmolality and hypernatremia: Secondary | ICD-10-CM | POA: Diagnosis not present

## 2018-08-23 DIAGNOSIS — M5416 Radiculopathy, lumbar region: Secondary | ICD-10-CM | POA: Diagnosis not present

## 2018-08-23 DIAGNOSIS — Z6841 Body Mass Index (BMI) 40.0 and over, adult: Secondary | ICD-10-CM | POA: Diagnosis not present

## 2018-08-23 DIAGNOSIS — E782 Mixed hyperlipidemia: Secondary | ICD-10-CM | POA: Diagnosis not present

## 2018-08-23 DIAGNOSIS — I10 Essential (primary) hypertension: Secondary | ICD-10-CM | POA: Diagnosis not present

## 2018-09-23 DIAGNOSIS — Z87891 Personal history of nicotine dependence: Secondary | ICD-10-CM | POA: Diagnosis not present

## 2018-09-23 DIAGNOSIS — M7732 Calcaneal spur, left foot: Secondary | ICD-10-CM | POA: Diagnosis not present

## 2018-09-23 DIAGNOSIS — I10 Essential (primary) hypertension: Secondary | ICD-10-CM | POA: Diagnosis not present

## 2018-09-23 DIAGNOSIS — M109 Gout, unspecified: Secondary | ICD-10-CM | POA: Diagnosis not present

## 2018-10-01 DIAGNOSIS — M109 Gout, unspecified: Secondary | ICD-10-CM | POA: Diagnosis not present

## 2018-10-01 DIAGNOSIS — Z6841 Body Mass Index (BMI) 40.0 and over, adult: Secondary | ICD-10-CM | POA: Diagnosis not present

## 2018-12-22 ENCOUNTER — Emergency Department (HOSPITAL_COMMUNITY): Payer: Medicare HMO

## 2018-12-22 ENCOUNTER — Other Ambulatory Visit: Payer: Self-pay

## 2018-12-22 ENCOUNTER — Encounter (HOSPITAL_COMMUNITY): Payer: Self-pay

## 2018-12-22 ENCOUNTER — Emergency Department (HOSPITAL_COMMUNITY)
Admission: EM | Admit: 2018-12-22 | Discharge: 2018-12-22 | Disposition: A | Discharge status: Home / Self Care | Payer: Medicare HMO | Attending: Emergency Medicine | Admitting: Emergency Medicine

## 2018-12-22 DIAGNOSIS — I10 Essential (primary) hypertension: Secondary | ICD-10-CM | POA: Diagnosis not present

## 2018-12-22 DIAGNOSIS — K869 Disease of pancreas, unspecified: Secondary | ICD-10-CM | POA: Diagnosis not present

## 2018-12-22 DIAGNOSIS — R109 Unspecified abdominal pain: Secondary | ICD-10-CM | POA: Diagnosis not present

## 2018-12-22 DIAGNOSIS — R1011 Right upper quadrant pain: Secondary | ICD-10-CM | POA: Diagnosis not present

## 2018-12-22 DIAGNOSIS — K8689 Other specified diseases of pancreas: Secondary | ICD-10-CM

## 2018-12-22 DIAGNOSIS — Z87891 Personal history of nicotine dependence: Secondary | ICD-10-CM | POA: Insufficient documentation

## 2018-12-22 HISTORY — DX: Unspecified osteoarthritis, unspecified site: M19.90

## 2018-12-22 LAB — URINALYSIS, ROUTINE W REFLEX MICROSCOPIC
Bilirubin Urine: NEGATIVE
Glucose, UA: NEGATIVE mg/dL
Hgb urine dipstick: NEGATIVE
Ketones, ur: NEGATIVE mg/dL
Leukocytes,Ua: NEGATIVE
Nitrite: NEGATIVE
Protein, ur: NEGATIVE mg/dL
Specific Gravity, Urine: 1.017 (ref 1.005–1.030)
pH: 5 (ref 5.0–8.0)

## 2018-12-22 LAB — COMPREHENSIVE METABOLIC PANEL
ALT: 17 U/L (ref 0–44)
AST: 19 U/L (ref 15–41)
Albumin: 3.8 g/dL (ref 3.5–5.0)
Alkaline Phosphatase: 57 U/L (ref 38–126)
Anion gap: 10 (ref 5–15)
BUN: 29 mg/dL — ABNORMAL HIGH (ref 8–23)
CO2: 29 mmol/L (ref 22–32)
Calcium: 9.7 mg/dL (ref 8.9–10.3)
Chloride: 99 mmol/L (ref 98–111)
Creatinine, Ser: 1.04 mg/dL — ABNORMAL HIGH (ref 0.44–1.00)
GFR calc Af Amer: >60 mL/min (ref >60)
GFR calc non Af Amer: 53 mL/min — ABNORMAL LOW (ref >60)
Glucose, Bld: 118 mg/dL — ABNORMAL HIGH (ref 70–99)
Potassium: 3.9 mmol/L (ref 3.5–5.1)
Sodium: 138 mmol/L (ref 135–145)
Total Bilirubin: 0.6 mg/dL (ref 0.3–1.2)
Total Protein: 8.1 g/dL (ref 6.5–8.1)

## 2018-12-22 LAB — CBC WITH DIFFERENTIAL/PLATELET
Abs Immature Granulocytes: 0.01 10*3/uL (ref 0.00–0.07)
Basophils Absolute: 0 10*3/uL (ref 0.0–0.1)
Basophils Relative: 0 %
Eosinophils Absolute: 0.3 10*3/uL (ref 0.0–0.5)
Eosinophils Relative: 5 %
HCT: 38.1 % (ref 36.0–46.0)
Hemoglobin: 12.1 g/dL (ref 12.0–15.0)
Immature Granulocytes: 0 %
Lymphocytes Relative: 38 %
Lymphs Abs: 2.2 10*3/uL (ref 0.7–4.0)
MCH: 31.2 pg (ref 26.0–34.0)
MCHC: 31.8 g/dL (ref 30.0–36.0)
MCV: 98.2 fL (ref 80.0–100.0)
Monocytes Absolute: 0.5 10*3/uL (ref 0.1–1.0)
Monocytes Relative: 8 %
Neutro Abs: 2.9 10*3/uL (ref 1.7–7.7)
Neutrophils Relative %: 49 %
Platelets: 248 10*3/uL (ref 150–400)
RBC: 3.88 MIL/uL (ref 3.87–5.11)
RDW: 14.6 % (ref 11.5–15.5)
WBC: 5.8 10*3/uL (ref 4.0–10.5)
nRBC: 0 % (ref 0.0–0.2)

## 2018-12-22 LAB — TROPONIN I: Troponin I: <0.03 ng/mL (ref <0.03)

## 2018-12-22 LAB — LIPASE, BLOOD: Lipase: 31 U/L (ref 11–51)

## 2018-12-22 MED ORDER — SUCRALFATE 1 G PO TABS: 1.0000 g | ORAL_TABLET | Freq: Three times a day (TID) | ORAL | 0 refills | Status: AC

## 2018-12-22 MED ORDER — PANTOPRAZOLE SODIUM 20 MG PO TBEC: 40.0000 mg | DELAYED_RELEASE_TABLET | Freq: Every day | ORAL | 0 refills | Status: AC

## 2018-12-22 MED ORDER — IOHEXOL 300 MG/ML  SOLN
100.0000 mL | Freq: Once | INTRAMUSCULAR | Status: AC | PRN
  Administered 2018-12-22: 11:00:00 100 mL via INTRAVENOUS

## 2018-12-22 NOTE — ED Triage Notes (Signed)
Pt c/o r sided abd pain since Friday.  Reports pain started 1 hour after eating a salad on Friday.  Denies n/v/d.  LBM was yesterday.

## 2018-12-22 NOTE — ED Provider Notes (Signed)
Emergency Department Provider Note   I have reviewed the triage vital signs and the nursing notes.   HISTORY  Chief Complaint Abdominal Pain   HPI Allison Bates is a 73 y.o. female with PMH of arthritis, HLD, and HTN presents to the emergency department for evaluation of right-sided abdominal pain.  Symptoms have been present for the past 6 days intermittently but worsening last night.  Patient denies any nausea or vomiting.  No diarrhea.  She is mainly feeling pain in the right mid abdomen to upper abdomen.  Some radiation to the flank occasionally.  Denies any dysuria, hesitancy, urgency.  Patient denies any chest pain.  No shortness of breath or cough.  He states that symptoms were worse last night and she started to come to the emergency department then but decided to wait until morning.  She states that her pain has improved somewhat but continues to have a "soreness" on the right.  No appreciable difference with eating or drinking.    Past Medical History:  Diagnosis Date  . Arthritis   . High cholesterol   . Hypertension     Patient Active Problem List   Diagnosis Date Noted  . Allergic rhinitis due to allergen 07/29/2016  . Chronic urticaria 07/29/2016  . Mild intermittent asthma, uncomplicated 07/29/2016    Past Surgical History:  Procedure Laterality Date  . ABDOMINAL HYSTERECTOMY      Allergies Patient has no known allergies.  Family History  Problem Relation Age of Onset  . Allergic rhinitis Neg Hx   . Angioedema Neg Hx   . Asthma Neg Hx   . Atopy Neg Hx   . Eczema Neg Hx   . Immunodeficiency Neg Hx   . Urticaria Neg Hx     Social History Social History   Tobacco Use  . Smoking status: Former Games developer  . Smokeless tobacco: Never Used  Substance Use Topics  . Alcohol use: No  . Drug use: No    Review of Systems  Constitutional: No fever/chills Eyes: No visual changes. ENT: No sore throat. Cardiovascular: Denies chest pain.  Respiratory: Denies shortness of breath. Gastrointestinal: Positive right abdominal pain.  No nausea, no vomiting.  No diarrhea.  No constipation. Genitourinary: Negative for dysuria. Musculoskeletal: Negative for back pain. Skin: Negative for rash. Neurological: Negative for headaches, focal weakness or numbness.  10-point ROS otherwise negative.  ____________________________________________   PHYSICAL EXAM:  VITAL SIGNS: ED Triage Vitals  Enc Vitals Group     BP 12/22/18 0851 133/89     Pulse Rate 12/22/18 0851 (!) 117     Resp 12/22/18 0851 20     Temp 12/22/18 0851 98.3 F (36.8 C)     Temp Source 12/22/18 0851 Oral     SpO2 12/22/18 0851 98 %     Weight 12/22/18 0849 230 lb (104.3 kg)     Height 12/22/18 0849  (1.575 m)     Pain Score 12/22/18 0849 8   Constitutional: Alert and oriented. Well appearing and in no acute distress. Eyes: Conjunctivae are normal. Head: Atraumatic. Nose: No congestion/rhinnorhea. Mouth/Throat: Mucous membranes are moist.  Neck: No stridor.   Cardiovascular: Normal rate, regular rhythm. Good peripheral circulation. Grossly normal heart sounds.   Respiratory: Normal respiratory effort.  No retractions. Lungs CTAB. Gastrointestinal: Soft with focal RUQ tenderness and positive Murphy's sign. No distention.  Musculoskeletal: No lower extremity tenderness nor edema. No gross deformities of extremities. Neurologic:  Normal speech and language. No gross  focal neurologic deficits are appreciated.  Skin:  Skin is warm, dry and intact. No rash noted.  ____________________________________________   LABS (all labs ordered are listed, but only abnormal results are displayed)  Labs Reviewed  COMPREHENSIVE METABOLIC PANEL - Abnormal; Notable for the following components:      Result Value   Glucose, Bld 118 (*)    BUN 29 (*)    Creatinine, Ser 1.04 (*)    GFR calc non Af Amer 53 (*)    All other components within normal limits  URINALYSIS,  ROUTINE W REFLEX MICROSCOPIC - Abnormal; Notable for the following components:   APPearance HAZY (*)    All other components within normal limits  CBC WITH DIFFERENTIAL/PLATELET  LIPASE, BLOOD  TROPONIN I   ____________________________________________  EKG   EKG Interpretation  Date/Time:  Wednesday December 22 2018 12:02:46 EDT Ventricular Rate:  95 PR Interval:    QRS Duration: 81 QT Interval:  348 QTC Calculation: 438 R Axis:   59 Text Interpretation:  Sinus rhythm Low voltage, precordial leads No STEMI.  Confirmed by Alona BeneLong, Lakindra Wible [phone]) on 12/22/2018 12:21:19 PM       ____________________________________________  RADIOLOGY  Ct Abdomen Pelvis W Contrast  Result Date: 12/22/2018 CLINICAL DATA:  Right-sided abdominal pain. EXAM: CT ABDOMEN AND PELVIS WITH CONTRAST TECHNIQUE: Multidetector CT imaging of the abdomen and pelvis was performed using the standard protocol following bolus administration of intravenous contrast. CONTRAST:  100mL OMNIPAQUE IOHEXOL 300 MG/ML  SOLN COMPARISON:  None. FINDINGS: Lower chest: The lung bases are clear without focal nodule, mass, or airspace disease. Heart size is normal. No significant pleural or pericardial effusion is present. Hepatobiliary: No focal liver abnormality is seen. No gallstones, gallbladder wall thickening, or biliary dilatation. Pancreas: A 2.4 Cm cyst is present at the tail the pancreas. No definite solid lesion is present. There is no communication with the main pancreatic duct. Recommend imaging at 6 months to evaluate interval growth. No other pancreatic lesions are present. Spleen: No acute infarct, hemorrhage, or mass lesion is present. Adrenals/Urinary Tract: The adrenal glands are normal bilaterally. Kidneys and ureters are within normal limits. Ureters are unremarkable. The urinary bladder is within normal limits. Stomach/Bowel: Stomach and duodenum are within normal limits. Small bowel is unremarkable. The appendix is  visualized and normal. The ascending and transverse colon are within normal limits. The descending colon is normal. Diverticular changes are present in the sigmoid colon. No focal inflammatory changes are evident. Vascular/Lymphatic: Atherosclerotic changes are present in the aorta. There is no aneurysm. No significant adenopathy is present. Reproductive: Status post hysterectomy. No adnexal masses. Other: No abdominal wall hernia or abnormality. No abdominopelvic ascites. Musculoskeletal: Mild endplate degenerative changes are present. There is slight anterolisthesis at L4-5. No focal lytic or blastic lesions are present. Bony pelvis is intact. The hips are located and within normal limits bilaterally. IMPRESSION: 1. No acute or focal abnormality to explain right-sided abdominal pain. 2. 2.4 cm cystic lesion at the tail the pancreas. Recommend imaging follow-up with contrast enhanced MRI or pancreas particle CT at 6 months. This recommendation follows ACR consensus guidelines: Management of Incidental Pancreatic Cysts: A White Paper of the ACR Incidental Findings Committee. J Am Coll Radiol 2017;14:911-923. 3.  Aortic Atherosclerosis (ICD10-I70.0). 4. Sigmoid diverticulosis without diverticulitis. These results will be called to the ordering clinician or representative by the Radiologist Assistant, and communication documented in the PACS or zVision Dashboard. Electronically Signed   By: Marin Robertshristopher  Mattern M.D.   On: 12/22/2018  11:50   US Abdomen Limited Ruq  Result Date: 12/22/2018 CLINICAL DATA:  Right upper quadrant pain 2 days. EXAM: ULTRASOUND ABDOMEN LIMITED RIGHT UPPER QUADRANT COMPARISON:  None. FINDINGS: Gallbladder: No gallstones or wall thickening visualized. No sonographic Murphy sign noted by sonographer. Common bile duct: Diameter: 4.5 mm. Liver: No focal lesion identified. Within normal limits in parenchymal echogenicity. Portal vein is patent on color Doppler imaging with normal direction of  blood flow towards the liver. IMPRESSION: No acute findings. Electronically Signed   By: Elberta Fortis M.D.   On: 12/22/2018 10:30    ____________________________________________   PROCEDURES  Procedure(s) performed:   Procedures  None  ____________________________________________   INITIAL IMPRESSION / ASSESSMENT AND PLAN / ED COURSE  Pertinent labs & imaging results that were available during my care of the patient were reviewed by me and considered in my medical decision making (see chart for details).   Patient presents to the emergency department for evaluation of right-sided abdominal pain over the past 6 days.  She has focal tenderness in the right upper quadrant with a positive Murphy sign.  Vital signs are significant for tachycardia without fever or hypotension.  No respiratory complaints.  Extremely low suspicion for ACS or PE.  Given focal tenderness over the gallbladder plan for right upper quadrant ultrasound along with screening lab work and urine analysis.  RUQ [place] and CT reviewed with no acute findings. CT ordered after negative RUQ [place] and no clear explanation for symptoms. Labs reviewed. Added Troponin and EKG which were negative. Plan for discharge with Protonix, Carafate, and GI follow up plan along with plan to call and f/u with PCP. Discussed ED return precautions in detail.  ____________________________________________  FINAL CLINICAL IMPRESSION(S) / ED DIAGNOSES  Final diagnoses:  RUQ abdominal pain  Pancreatic mass     MEDICATIONS GIVEN DURING THIS VISIT:  Medications  iohexol (OMNIPAQUE) 300 MG/ML solution 100 mL (100 mLs Intravenous Contrast Given 12/22/18 1116)     NEW OUTPATIENT MEDICATIONS STARTED DURING THIS VISIT:  Discharge Medication List as of 12/22/2018 12:36 PM    START taking these medications   Details  pantoprazole (PROTONIX) 20 MG tablet Take 2 tablets (40 mg total) by mouth daily for 30 days., Starting Wed 12/22/2018, Until Fri  01/21/2019, Normal    sucralfate (CARAFATE) 1 g tablet Take 1 tablet (1 g total) by mouth 4 (four) times daily -  with meals and at bedtime for 14 days., Starting Wed 12/22/2018, Until Wed 01/05/2019, Normal        Note:  This document was prepared using Dragon voice recognition software and may include unintentional dictation errors.  Alona Bene, MD Emergency Medicine    Bellatrix Devonshire, Arlyss Repress, MD 12/22/18 [phone]

## 2018-12-22 NOTE — Discharge Instructions (Addendum)
You have been seen in the Emergency Department (ED) for abdominal pain.  Your evaluation did not identify a clear cause of your symptoms but was generally reassuring.  Discuss your pancreatic mass with your PCP. They will need to advise you on repeat imaging in the coming months.   Please follow up as instructed above regarding todays emergent visit and the symptoms that are bothering you.  Return to the ED if your abdominal pain worsens or fails to improve, you develop bloody vomiting, bloody diarrhea, you are unable to tolerate fluids due to vomiting, fever greater than 101, or other symptoms that concern you.

## 2019-02-07 ENCOUNTER — Other Ambulatory Visit (HOSPITAL_COMMUNITY): Payer: Self-pay | Admitting: Family Medicine

## 2019-02-07 DIAGNOSIS — Z1231 Encounter for screening mammogram for malignant neoplasm of breast: Secondary | ICD-10-CM

## 2019-02-11 ENCOUNTER — Ambulatory Visit (HOSPITAL_COMMUNITY): Payer: Medicare HMO

## 2019-02-14 ENCOUNTER — Other Ambulatory Visit (HOSPITAL_COMMUNITY): Payer: Self-pay | Admitting: Family Medicine

## 2019-02-14 DIAGNOSIS — Z1231 Encounter for screening mammogram for malignant neoplasm of breast: Secondary | ICD-10-CM

## 2019-02-23 ENCOUNTER — Ambulatory Visit (HOSPITAL_COMMUNITY): Payer: Medicare HMO

## 2019-02-23 ENCOUNTER — Ambulatory Visit (HOSPITAL_COMMUNITY)
Admission: RE | Admit: 2019-02-23 | Discharge: 2019-02-23 | Disposition: A | Discharge status: Home / Self Care | Payer: Medicare HMO | Source: Ambulatory Visit | Attending: Family Medicine | Admitting: Family Medicine

## 2019-02-23 ENCOUNTER — Other Ambulatory Visit: Payer: Self-pay

## 2019-02-23 DIAGNOSIS — Z1231 Encounter for screening mammogram for malignant neoplasm of breast: Secondary | ICD-10-CM | POA: Diagnosis not present

## 2019-03-01 DIAGNOSIS — E870 Hyperosmolality and hypernatremia: Secondary | ICD-10-CM | POA: Diagnosis not present

## 2019-03-01 DIAGNOSIS — I10 Essential (primary) hypertension: Secondary | ICD-10-CM | POA: Diagnosis not present

## 2019-03-01 DIAGNOSIS — E119 Type 2 diabetes mellitus without complications: Secondary | ICD-10-CM | POA: Diagnosis not present

## 2019-03-01 DIAGNOSIS — E782 Mixed hyperlipidemia: Secondary | ICD-10-CM | POA: Diagnosis not present

## 2019-03-01 DIAGNOSIS — N183 Chronic kidney disease, stage 3 (moderate): Secondary | ICD-10-CM | POA: Diagnosis not present

## 2019-03-08 DIAGNOSIS — E1122 Type 2 diabetes mellitus with diabetic chronic kidney disease: Secondary | ICD-10-CM | POA: Diagnosis not present

## 2019-03-08 DIAGNOSIS — E870 Hyperosmolality and hypernatremia: Secondary | ICD-10-CM | POA: Diagnosis not present

## 2019-03-08 DIAGNOSIS — Z0001 Encounter for general adult medical examination with abnormal findings: Secondary | ICD-10-CM | POA: Diagnosis not present

## 2019-03-08 DIAGNOSIS — E782 Mixed hyperlipidemia: Secondary | ICD-10-CM | POA: Diagnosis not present

## 2019-03-08 DIAGNOSIS — M5416 Radiculopathy, lumbar region: Secondary | ICD-10-CM | POA: Diagnosis not present

## 2019-03-08 DIAGNOSIS — J4520 Mild intermittent asthma, uncomplicated: Secondary | ICD-10-CM | POA: Diagnosis not present

## 2019-03-08 DIAGNOSIS — I10 Essential (primary) hypertension: Secondary | ICD-10-CM | POA: Diagnosis not present

## 2019-03-08 DIAGNOSIS — Z6841 Body Mass Index (BMI) 40.0 and over, adult: Secondary | ICD-10-CM | POA: Diagnosis not present

## 2019-04-08 DIAGNOSIS — I10 Essential (primary) hypertension: Secondary | ICD-10-CM | POA: Diagnosis not present

## 2019-04-08 DIAGNOSIS — E1122 Type 2 diabetes mellitus with diabetic chronic kidney disease: Secondary | ICD-10-CM | POA: Diagnosis not present

## 2019-04-08 DIAGNOSIS — E7800 Pure hypercholesterolemia, unspecified: Secondary | ICD-10-CM | POA: Diagnosis not present

## 2019-05-09 DIAGNOSIS — I10 Essential (primary) hypertension: Secondary | ICD-10-CM | POA: Diagnosis not present

## 2019-05-09 DIAGNOSIS — E119 Type 2 diabetes mellitus without complications: Secondary | ICD-10-CM | POA: Diagnosis not present

## 2019-06-08 DIAGNOSIS — E782 Mixed hyperlipidemia: Secondary | ICD-10-CM | POA: Diagnosis not present

## 2019-06-08 DIAGNOSIS — I10 Essential (primary) hypertension: Secondary | ICD-10-CM | POA: Diagnosis not present

## 2019-06-27 DIAGNOSIS — L509 Urticaria, unspecified: Secondary | ICD-10-CM | POA: Diagnosis not present

## 2019-06-27 DIAGNOSIS — Z6841 Body Mass Index (BMI) 40.0 and over, adult: Secondary | ICD-10-CM | POA: Diagnosis not present

## 2019-08-08 DIAGNOSIS — I10 Essential (primary) hypertension: Secondary | ICD-10-CM | POA: Diagnosis not present

## 2019-08-08 DIAGNOSIS — E782 Mixed hyperlipidemia: Secondary | ICD-10-CM | POA: Diagnosis not present

## 2019-09-05 DIAGNOSIS — E782 Mixed hyperlipidemia: Secondary | ICD-10-CM | POA: Diagnosis not present

## 2019-09-05 DIAGNOSIS — N183 Chronic kidney disease, stage 3 unspecified: Secondary | ICD-10-CM | POA: Diagnosis not present

## 2019-09-05 DIAGNOSIS — I10 Essential (primary) hypertension: Secondary | ICD-10-CM | POA: Diagnosis not present

## 2019-09-05 DIAGNOSIS — E559 Vitamin D deficiency, unspecified: Secondary | ICD-10-CM | POA: Diagnosis not present

## 2019-09-05 DIAGNOSIS — E870 Hyperosmolality and hypernatremia: Secondary | ICD-10-CM | POA: Diagnosis not present

## 2019-09-05 DIAGNOSIS — E119 Type 2 diabetes mellitus without complications: Secondary | ICD-10-CM | POA: Diagnosis not present

## 2019-09-07 DIAGNOSIS — J4520 Mild intermittent asthma, uncomplicated: Secondary | ICD-10-CM | POA: Diagnosis not present

## 2019-09-07 DIAGNOSIS — E1122 Type 2 diabetes mellitus with diabetic chronic kidney disease: Secondary | ICD-10-CM | POA: Diagnosis not present

## 2019-09-07 DIAGNOSIS — E870 Hyperosmolality and hypernatremia: Secondary | ICD-10-CM | POA: Diagnosis not present

## 2019-09-07 DIAGNOSIS — E559 Vitamin D deficiency, unspecified: Secondary | ICD-10-CM | POA: Diagnosis not present

## 2019-09-07 DIAGNOSIS — E782 Mixed hyperlipidemia: Secondary | ICD-10-CM | POA: Diagnosis not present

## 2019-09-07 DIAGNOSIS — I10 Essential (primary) hypertension: Secondary | ICD-10-CM | POA: Diagnosis not present

## 2019-09-07 DIAGNOSIS — M1712 Unilateral primary osteoarthritis, left knee: Secondary | ICD-10-CM | POA: Diagnosis not present

## 2019-09-07 DIAGNOSIS — Z6841 Body Mass Index (BMI) 40.0 and over, adult: Secondary | ICD-10-CM | POA: Diagnosis not present

## 2019-09-30 ENCOUNTER — Other Ambulatory Visit: Payer: Self-pay

## 2019-09-30 ENCOUNTER — Encounter (HOSPITAL_COMMUNITY): Payer: Self-pay

## 2019-09-30 ENCOUNTER — Emergency Department (HOSPITAL_COMMUNITY): Payer: Medicare HMO

## 2019-09-30 ENCOUNTER — Emergency Department (HOSPITAL_COMMUNITY)
Admission: EM | Admit: 2019-09-30 | Discharge: 2019-09-30 | Disposition: A | Discharge status: Home / Self Care | Payer: Medicare HMO | Attending: Emergency Medicine | Admitting: Emergency Medicine

## 2019-09-30 DIAGNOSIS — K862 Cyst of pancreas: Secondary | ICD-10-CM | POA: Diagnosis not present

## 2019-09-30 DIAGNOSIS — Z87891 Personal history of nicotine dependence: Secondary | ICD-10-CM | POA: Insufficient documentation

## 2019-09-30 DIAGNOSIS — U071 COVID-19: Secondary | ICD-10-CM | POA: Insufficient documentation

## 2019-09-30 DIAGNOSIS — L509 Urticaria, unspecified: Secondary | ICD-10-CM | POA: Diagnosis not present

## 2019-09-30 DIAGNOSIS — Z79899 Other long term (current) drug therapy: Secondary | ICD-10-CM | POA: Diagnosis not present

## 2019-09-30 DIAGNOSIS — R109 Unspecified abdominal pain: Secondary | ICD-10-CM | POA: Diagnosis not present

## 2019-09-30 DIAGNOSIS — K5730 Diverticulosis of large intestine without perforation or abscess without bleeding: Secondary | ICD-10-CM | POA: Diagnosis not present

## 2019-09-30 DIAGNOSIS — R140 Abdominal distension (gaseous): Secondary | ICD-10-CM | POA: Diagnosis not present

## 2019-09-30 DIAGNOSIS — R531 Weakness: Secondary | ICD-10-CM | POA: Diagnosis present

## 2019-09-30 DIAGNOSIS — R1011 Right upper quadrant pain: Secondary | ICD-10-CM | POA: Diagnosis not present

## 2019-09-30 DIAGNOSIS — K5900 Constipation, unspecified: Secondary | ICD-10-CM

## 2019-09-30 DIAGNOSIS — I10 Essential (primary) hypertension: Secondary | ICD-10-CM | POA: Diagnosis not present

## 2019-09-30 DIAGNOSIS — R000 Tachycardia, unspecified: Secondary | ICD-10-CM | POA: Diagnosis not present

## 2019-09-30 LAB — CBC WITH DIFFERENTIAL/PLATELET
Abs Immature Granulocytes: 0.01 10*3/uL (ref 0.00–0.07)
Basophils Absolute: 0 10*3/uL (ref 0.0–0.1)
Basophils Relative: 0 %
Eosinophils Absolute: 0.1 10*3/uL (ref 0.0–0.5)
Eosinophils Relative: 1 %
HCT: 40.2 % (ref 36.0–46.0)
Hemoglobin: 12.7 g/dL (ref 12.0–15.0)
Immature Granulocytes: 0 %
Lymphocytes Relative: 19 %
Lymphs Abs: 1.2 10*3/uL (ref 0.7–4.0)
MCH: 31.5 pg (ref 26.0–34.0)
MCHC: 31.6 g/dL (ref 30.0–36.0)
MCV: 99.8 fL (ref 80.0–100.0)
Monocytes Absolute: 0.5 10*3/uL (ref 0.1–1.0)
Monocytes Relative: 7 %
Neutro Abs: 4.7 10*3/uL (ref 1.7–7.7)
Neutrophils Relative %: 73 %
Platelets: 182 10*3/uL (ref 150–400)
RBC: 4.03 MIL/uL (ref 3.87–5.11)
RDW: 14.1 % (ref 11.5–15.5)
WBC: 6.5 10*3/uL (ref 4.0–10.5)
nRBC: 0 % (ref 0.0–0.2)

## 2019-09-30 LAB — COMPREHENSIVE METABOLIC PANEL
ALT: 19 U/L (ref 0–44)
AST: 31 U/L (ref 15–41)
Albumin: 3.6 g/dL (ref 3.5–5.0)
Alkaline Phosphatase: 53 U/L (ref 38–126)
Anion gap: 7 (ref 5–15)
BUN: 25 mg/dL — ABNORMAL HIGH (ref 8–23)
CO2: 26 mmol/L (ref 22–32)
Calcium: 8.3 mg/dL — ABNORMAL LOW (ref 8.9–10.3)
Chloride: 103 mmol/L (ref 98–111)
Creatinine, Ser: 1.24 mg/dL — ABNORMAL HIGH (ref 0.44–1.00)
GFR calc Af Amer: 49 mL/min — ABNORMAL LOW (ref >60)
GFR calc non Af Amer: 42 mL/min — ABNORMAL LOW (ref >60)
Glucose, Bld: 143 mg/dL — ABNORMAL HIGH (ref 70–99)
Potassium: 5 mmol/L (ref 3.5–5.1)
Sodium: 136 mmol/L (ref 135–145)
Total Bilirubin: 0.5 mg/dL (ref 0.3–1.2)
Total Protein: 7.7 g/dL (ref 6.5–8.1)

## 2019-09-30 LAB — URINALYSIS, ROUTINE W REFLEX MICROSCOPIC
Bilirubin Urine: NEGATIVE
Glucose, UA: NEGATIVE mg/dL
Hgb urine dipstick: NEGATIVE
Ketones, ur: NEGATIVE mg/dL
Leukocytes,Ua: NEGATIVE
Nitrite: NEGATIVE
Protein, ur: NEGATIVE mg/dL
Specific Gravity, Urine: 1.017 (ref 1.005–1.030)
pH: 5 (ref 5.0–8.0)

## 2019-09-30 LAB — LIPASE, BLOOD: Lipase: 31 U/L (ref 11–51)

## 2019-09-30 MED ORDER — DIPHENHYDRAMINE HCL 50 MG/ML IJ SOLN
50.0000 mg | Freq: Once | INTRAMUSCULAR | Status: AC
  Administered 2019-09-30: 05:00:00 50 mg via INTRAMUSCULAR

## 2019-09-30 MED ORDER — DIPHENHYDRAMINE HCL 50 MG/ML IJ SOLN
INTRAMUSCULAR | Status: AC
  Filled 2019-09-30: qty 1

## 2019-09-30 MED ORDER — ONDANSETRON HCL 4 MG PO TABS: 4.0000 mg | ORAL_TABLET | Freq: Three times a day (TID) | ORAL | 0 refills | Status: AC | PRN

## 2019-09-30 NOTE — ED Provider Notes (Signed)
[facility] EMERGENCY DEPARTMENT Provider Note   CSN: [id number] Arrival date & time: 09/30/19  0101   Time seen 1:30 AM  History Chief Complaint  Patient presents with  . Weakness    [name] is a 74 y.o. female.  HPI   Patient states Saturday or Sunday, January 16 or 17 she ate some barbecue potato chips which had a lot of barbecue on it and the following day, January 18 she started having abdominal pain that she describes as gas pains in the center of her stomach and states her abdomen felt weak like a hunger feeling she states the gas pain comes and goes.  She tried Pepto-Bismol which eased a little bit.  She feels like her abdomen gets bloated.  She had nausea for the first time today but no vomiting or diarrhea.  She states her last bowel movement was yesterday and it was a small amount and normal.  She states she felt better afterwards.  She denies any loss of appetite but states she has trouble chewing her food due to her teeth.  She denies having any relation to food, stating food does not make her abdomen feel worse.  She states she is passing a lot of gas which makes her feel better.  She denies fever, cough, sore throat, but does have mild rhinorrhea.  She states she has myalgias "like the flu".  She has chronic back pain and states nothing is new.  She denies being around anybody else who is sick.  She denies having feeling of dizziness or feeling lightheaded.  She denies any known exposure to Covid.  PCP Burdine, Ananias PilgrimSteven E, MD   Past Medical History:  Diagnosis Date  . Arthritis   . High cholesterol   . Hypertension     Patient Active Problem List   Diagnosis Date Noted  . Allergic rhinitis due to allergen 07/29/2016  . Chronic urticaria 07/29/2016  . Mild intermittent asthma, uncomplicated 07/29/2016    Past Surgical History:  Procedure Laterality Date  . ABDOMINAL HYSTERECTOMY       OB History   No obstetric history on file.     Family History    Problem Relation Age of Onset  . Allergic rhinitis Neg Hx   . Angioedema Neg Hx   . Asthma Neg Hx   . Atopy Neg Hx   . Eczema Neg Hx   . Immunodeficiency Neg Hx   . Urticaria Neg Hx     Social History   Tobacco Use  . Smoking status: Former Games developermoker  . Smokeless tobacco: Never Used  Substance Use Topics  . Alcohol use: No  . Drug use: No  Lives at home  Home Medications Prior to Admission medications   Medication Sig Start Date End Date Taking? Authorizing Provider  amLODipine (NORVASC) 10 MG tablet Take 10 mg by mouth daily.  07/16/16   [provider]  atorvastatin (LIPITOR) 20 MG tablet Take 1 tablet by mouth daily. 11/13/18   [provider]  fluticasone (FLONASE) 50 MCG/ACT nasal spray Place 2 sprays into both nostrils daily. 07/29/16   Alfonse SpruceGallagher, Joel Louis, MD  HYDROcodone-acetaminophen (NORCO/VICODIN) 5-325 MG tablet Take 1 tablet by mouth every 4 (four) hours as needed. Patient not taking: Reported on 12/22/2018 09/16/16   Ivery QualeBryant, Hobson, PA-C  ibuprofen (ADVIL,MOTRIN) 400 MG tablet Take 1 tablet (400 mg total) by mouth 4 (four) times daily. Patient not taking: Reported on 12/22/2018 09/16/16   Ivery QualeBryant, Hobson, PA-C  montelukast (  SINGULAIR) 10 MG tablet Take 1 tablet (10 mg total) by mouth at bedtime. 07/29/16   Valentina Shaggy, MD  ondansetron (ZOFRAN) 4 MG tablet Take 1 tablet (4 mg total) by mouth every 8 (eight) hours as needed. 09/30/19   Rolland Porter, MD  pantoprazole (PROTONIX) 20 MG tablet Take 2 tablets (40 mg total) by mouth daily for 30 days. 12/22/18 01/21/19  Long, Wonda Olds, MD  sucralfate (CARAFATE) 1 g tablet Take 1 tablet (1 g total) by mouth 4 (four) times daily -  with meals and at bedtime for 14 days. 12/22/18 01/05/19  Long, Wonda Olds, MD  valsartan-hydrochlorothiazide (DIOVAN-HCT) 320-25 MG tablet Take 1 tablet by mouth daily.  06/14/16   [provider]    Allergies    Contrast media [iodinated diagnostic agents]  Review of Systems    Review of Systems  All other systems reviewed and are negative.   Physical Exam Updated Vital Signs BP 136/85   Pulse 98   Temp 99.5 F (37.5 C) (Oral)   Resp (!) 21   Ht 5\' 2"  (1.575 m)   Wt 104.3 kg   SpO2 90%   BMI 42.07 kg/m   Physical Exam Vitals and nursing note reviewed.  Constitutional:      General: She is not in acute distress.    Appearance: Normal appearance. She is well-developed. She is not ill-appearing or toxic-appearing.  HENT:     Head: Normocephalic and atraumatic.     Right Ear: External ear normal.     Left Ear: External ear normal.     Nose: Nose normal. No mucosal edema or rhinorrhea.     Mouth/Throat:     Dentition: No dental abscesses.     Pharynx: No uvula swelling.  Eyes:     Extraocular Movements: Extraocular movements intact.     Conjunctiva/sclera: Conjunctivae normal.     Pupils: Pupils are equal, round, and reactive to light.  Cardiovascular:     Rate and Rhythm: Normal rate and regular rhythm.     Heart sounds: Normal heart sounds. No murmur. No friction rub. No gallop.   Pulmonary:     Effort: Pulmonary effort is normal. No respiratory distress.     Breath sounds: Normal breath sounds. No wheezing, rhonchi or rales.  Chest:     Chest wall: No tenderness or crepitus.  Abdominal:     General: Bowel sounds are normal. There is no distension.     Palpations: Abdomen is soft.     Tenderness: There is no abdominal tenderness. There is no guarding or rebound.  Musculoskeletal:        General: No tenderness. Normal range of motion.     Cervical back: Full passive range of motion without pain, normal range of motion and neck supple.     Right lower leg: No edema.     Left lower leg: No edema.     Comments: Moves all extremities well.   Skin:    General: Skin is warm and dry.     Coloration: Skin is not pale.     Findings: No erythema or rash.  Neurological:     General: No focal deficit present.     Mental Status: She is alert and  oriented to person, place, and time.     Cranial Nerves: No cranial nerve deficit.  Psychiatric:        Mood and Affect: Mood normal. Mood is not anxious.        Speech:  Speech normal.        Behavior: Behavior normal.        Thought Content: Thought content normal.     ED Results / Procedures / Treatments   Labs (all labs ordered are listed, but only abnormal results are displayed) Results for orders placed or performed during the hospital encounter of 09/30/19  Comprehensive metabolic panel  Result Value Ref Range   Sodium 136 135 - 145 mmol/L   Potassium 5.0 3.5 - 5.1 mmol/L   Chloride 103 98 - 111 mmol/L   CO2 26 22 - 32 mmol/L   Glucose, Bld 143 (H) 70 - 99 mg/dL   BUN 25 (H) 8 - 23 mg/dL   Creatinine, Ser 4.01 (H) 0.44 - 1.00 mg/dL   Calcium 8.3 (L) 8.9 - 10.3 mg/dL   Total Protein 7.7 6.5 - 8.1 g/dL   Albumin 3.6 3.5 - 5.0 g/dL   AST 31 15 - 41 U/L   ALT 19 0 - 44 U/L   Alkaline Phosphatase 53 38 - 126 U/L   Total Bilirubin 0.5 0.3 - 1.2 mg/dL   GFR calc non Af Amer 42 (L) >60 mL/min   GFR calc Af Amer 49 (L) >60 mL/min   Anion gap 7 5 - 15  Lipase, blood  Result Value Ref Range   Lipase 31 11 - 51 U/L  CBC with Differential  Result Value Ref Range   WBC 6.5 4.0 - 10.5 K/uL   RBC 4.03 3.87 - 5.11 MIL/uL   Hemoglobin 12.7 12.0 - 15.0 g/dL   HCT 02.7 25.3 - 66.4 %   MCV 99.8 80.0 - 100.0 fL   MCH 31.5 26.0 - 34.0 pg   MCHC 31.6 30.0 - 36.0 g/dL   RDW 40.3 47.4 - 25.9 %   Platelets 182 150 - 400 K/uL   nRBC 0.0 0.0 - 0.2 %   Neutrophils Relative % 73 %   Neutro Abs 4.7 1.7 - 7.7 K/uL   Lymphocytes Relative 19 %   Lymphs Abs 1.2 0.7 - 4.0 K/uL   Monocytes Relative 7 %   Monocytes Absolute 0.5 0.1 - 1.0 K/uL   Eosinophils Relative 1 %   Eosinophils Absolute 0.1 0.0 - 0.5 K/uL   Basophils Relative 0 %   Basophils Absolute 0.0 0.0 - 0.1 K/uL   Immature Granulocytes 0 %   Abs Immature Granulocytes 0.01 0.00 - 0.07 K/uL  Urinalysis, Routine w reflex  microscopic  Result Value Ref Range   Color, Urine YELLOW YELLOW   APPearance HAZY (A) CLEAR   Specific Gravity, Urine 1.017 1.005 - 1.030   pH 5.0 5.0 - 8.0   Glucose, UA NEGATIVE NEGATIVE mg/dL   Hgb urine dipstick NEGATIVE NEGATIVE   Bilirubin Urine NEGATIVE NEGATIVE   Ketones, ur NEGATIVE NEGATIVE mg/dL   Protein, ur NEGATIVE NEGATIVE mg/dL   Nitrite NEGATIVE NEGATIVE   Leukocytes,Ua NEGATIVE NEGATIVE   Laboratory interpretation all normal except renal insufficiency    EKG EKG Interpretation  Date/Time:  Friday September 30 2019 01:18:58 EST Ventricular Rate:  116 PR Interval:    QRS Duration: 96 QT Interval:  339 QTC Calculation: 471 R Axis:   82 Text Interpretation: Sinus tachycardia Borderline right axis deviation Low voltage, extremity and precordial leads Baseline wander in lead(s) II Electrode noise No significant change since last tracing 22 Dec 2018 Confirmed by Devoria Albe ([id number]) on 09/30/2019 1:22:10 AM   Radiology CT Abdomen Pelvis Wo Contrast  Result Date: 09/30/2019 CLINICAL  DATA:  Abdominal distension. EXAM: CT ABDOMEN AND PELVIS WITHOUT CONTRAST TECHNIQUE: Multidetector CT imaging of the abdomen and pelvis was performed following the standard protocol without IV contrast. COMPARISON:  Radiograph earlier this day. CT 12/22/2018 FINDINGS: Lower chest: Subsegmental atelectasis or scarring in the right lower lobe. There are coronary artery calcifications. Heart size is normal. Hepatobiliary: Hepatic steatosis. No evidence of focal lesion on noncontrast exam. Gallbladder physiologically distended, no calcified stone. No biliary dilatation. Pancreas: No ductal dilatation or inflammation. Cyst in the pancreatic tail measures 1.5 cm, previously 2.4 cm. Spleen: Normal in size without focal abnormality. Adrenals/Urinary Tract: No adrenal nodule. No hydronephrosis or perinephric edema. Minimal extrarenal pelvis configuration on the left, unchanged from prior exam. No renal or  ureteral calculi. The urinary bladder is physiologically distended. No bladder wall thickening. Stomach/Bowel: No bowel obstruction, administered enteric contrast reaches the colon. Stomach is unremarkable. No small bowel inflammation or obstruction. Normal appendix. Mild distal colonic diverticulosis without diverticulitis. No colonic wall thickening. Vascular/Lymphatic: Aorto bi-iliac atherosclerosis. No aneurysm. No enlarged lymph nodes in the abdomen or pelvis. Reproductive: Status post hysterectomy. No adnexal masses. Other: No ascites or free air. No intra-abdominal mass. Tiny fat containing umbilical hernia. Musculoskeletal: Multilevel degenerative change in the lumbar spine with degenerative disc disease and facet hypertrophy. Sacroiliac joint degenerative change. IMPRESSION: 1. No acute abnormality in the abdomen/pelvis. 2. Mild distal colonic diverticulosis without diverticulitis. 3. Hepatic steatosis. 4. 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 This favors a benign process. Electronically Signed   By: Narda Rutherford M.D.   On: 09/30/2019 05:27   DG Chest Port 1 View  Result Date: 09/30/2019 CLINICAL DATA:  Abdominal pain EXAM: PORTABLE CHEST 1 VIEW COMPARISON:  None. FINDINGS: The heart size and mediastinal contours are within normal limits. Both lungs are clear. The visualized skeletal structures are unremarkable. IMPRESSION: No active disease. Electronically Signed   By: Deatra Robinson M.D.   On: 09/30/2019 02:25   DG Abd Portable 2 Views  Result Date: 09/30/2019 CLINICAL DATA:  Abdominal pain and bloating EXAM: PORTABLE ABDOMEN - 2 VIEW COMPARISON:  None. FINDINGS: The bowel gas pattern is normal. There is no evidence of free air. No radio-opaque calculi or other significant radiographic abnormality is seen. IMPRESSION: Negative. Electronically Signed   By: Deatra Robinson M.D.   On: 09/30/2019 02:27    Procedures Procedures (including critical care  time)  Medications Ordered in ED Medications  diphenhydrAMINE (BENADRYL) 50 MG/ML injection (0 mg  Hold 09/30/19 0459)  diphenhydrAMINE (BENADRYL) injection 50 mg (50 mg Intramuscular Given 09/30/19 0456)    ED Course  I have reviewed the triage vital signs and the nursing notes.  Pertinent labs & imaging results that were available during my care of the patient were reviewed by me and considered in my medical decision making (see chart for details).    MDM Rules/Calculators/A&P                      Started out with just a portable 2 view abdomen and chest.  The radiologist did not read anything acute there.  Blood work was done and CT abdomen was ordered.  When I review her prior testing her creatinine was borderline so she was given CT with oral contrast.  Review of her prior visit in April 2020 patient was having right-sided abdominal pain.  She had a right upper quadrant ultrasound done that was negative for acute findings.  She  had a CT abdomen/pelvis with contrast done which showed a 2.4 cm cystic lesion on the tail the pancreas, it was recommended she have a contrast-enhanced MRI or CT at 6 months.  She had sigmoid diverticulosis without diverticulitis.  She had nothing acute to explain her symptoms.  She was discharged home on Protonix and sucralfate and she states once that prescription was gone she did not continue on it.  4:55 AM patient was on her way to have her CT and pointed out to the x-ray tech that she had broken out in hives.  She does not have any difficulty swallowing or breathing.  She has a cluster in her medial right proximal thigh and some on her right neck.  She states she has had hives before and sometimes she will just be sitting doing nothing and they will pop out.  Please note there was a commotion in the room immediately across from her prior to this and she does admit to being scared because she did not know what was going on.  She was given Benadryl 50 mg  IM.  Patient CT scan does not show any acute problems, the pancreatic cyst she had before is actually getting smaller.  She was advised to take MiraLAX because it does sound like she had some constipation and she can take Gaviscon for gas.  She should let her doctor know if she is not improving because she may warrant a GI referral.  Final Clinical Impression(s) / ED Diagnoses Final diagnoses:  Abdominal pain, unspecified abdominal location  Hives  Constipation, unspecified constipation type    Rx / DC Orders ED Discharge Orders         Ordered    ondansetron (ZOFRAN) 4 MG tablet  Every 8 hours PRN     09/30/19 0540         Plan discharge  Devoria Albe, MD, Concha Pyo, MD 09/30/19 [phone]

## 2019-09-30 NOTE — ED Notes (Signed)
Pt informed CT staff that she was starting to have allergic reaction to oral contract. Orders given for 50mg  IM benadryl. Medication given to pt prior to going to CT.

## 2019-09-30 NOTE — Discharge Instructions (Signed)
Use the Zofran for nausea if needed.  Your symptoms sound like you may have some constipation, you can take MiraLAX over-the-counter 3 times a day until you start having good bowel movements and then once a day to keep from getting constipated again.  For the gas you can take Gaviscon over-the-counter.  Please let Dr. Leandrew Koyanagi know about your ED visit, if you continue to have symptoms he may want to refer you to a stomach specialist.  Return to the emergency department if you have uncontrollable vomiting, you have uncontrolled diarrhea or you seem worse.

## 2019-09-30 NOTE — ED Triage Notes (Signed)
Pt states she feels like her stomach is not right, states "just feels weak"  Pt reports feeling "Hot" and then sweating at times.  Pt denies exposure to known covid.   Pt reports a lot of gas and had some diarrhea early in the week after eating potato chips.

## 2019-09-30 NOTE — ED Notes (Signed)
Pt transported to CT at this time.

## 2019-10-01 LAB — NOVEL CORONAVIRUS, NAA (HOSP ORDER, SEND-OUT TO REF LAB; TAT 18-24 HRS): SARS-CoV-2, NAA: DETECTED — AB

## 2019-10-02 ENCOUNTER — Telehealth: Payer: Self-pay | Admitting: Adult Health

## 2019-10-02 NOTE — Telephone Encounter (Signed)
Called to discuss with Allison Bates about Covid symptoms and the use of bamlanivimab, a monoclonal antibody infusion for those with mild to moderate Covid symptoms and at a high risk of hospitalization.     Pt is qualified for this infusion at the [facility] infusion center due to co-morbid conditions and/or a member of an at-risk group,She wants to discuss with her family and think about it . Symptoms tier reviewed as well as criteria for ending isolation.  Symptoms reviewed that would warrant ED/Hospital evaluation. Preventative practices reviewed. Patient verbalized understanding. Patient advised to call back if he decides that he does want to get infusion. Callback number to the Covid hotline  given. Patient advised to go to Urgent care or ED with severe symptoms. Last date she would be eligible for infusion is 1.28/21    Patient Active Problem List   Diagnosis Date Noted  . Allergic rhinitis due to allergen 07/29/2016  . Chronic urticaria 07/29/2016  . Mild intermittent asthma, uncomplicated 07/29/2016    Tammy Parrett NP-C  [facility]    10/02/2019

## 2019-10-03 ENCOUNTER — Telehealth (HOSPITAL_COMMUNITY): Payer: Self-pay

## 2019-10-04 NOTE — Telephone Encounter (Signed)
Patient called back and feeling much better. She declines infusion at this time.

## 2019-10-07 DIAGNOSIS — E7849 Other hyperlipidemia: Secondary | ICD-10-CM | POA: Diagnosis not present

## 2019-10-07 DIAGNOSIS — I10 Essential (primary) hypertension: Secondary | ICD-10-CM | POA: Diagnosis not present

## 2019-11-04 DIAGNOSIS — I10 Essential (primary) hypertension: Secondary | ICD-10-CM | POA: Diagnosis not present

## 2019-11-04 DIAGNOSIS — E7849 Other hyperlipidemia: Secondary | ICD-10-CM | POA: Diagnosis not present

## 2019-11-23 DIAGNOSIS — R946 Abnormal results of thyroid function studies: Secondary | ICD-10-CM | POA: Diagnosis not present

## 2019-11-23 DIAGNOSIS — E875 Hyperkalemia: Secondary | ICD-10-CM | POA: Diagnosis not present

## 2019-11-23 DIAGNOSIS — E782 Mixed hyperlipidemia: Secondary | ICD-10-CM | POA: Diagnosis not present

## 2019-11-23 DIAGNOSIS — E119 Type 2 diabetes mellitus without complications: Secondary | ICD-10-CM | POA: Diagnosis not present

## 2019-11-23 DIAGNOSIS — E1122 Type 2 diabetes mellitus with diabetic chronic kidney disease: Secondary | ICD-10-CM | POA: Diagnosis not present

## 2019-11-23 DIAGNOSIS — N183 Chronic kidney disease, stage 3 unspecified: Secondary | ICD-10-CM | POA: Diagnosis not present

## 2019-11-23 DIAGNOSIS — I10 Essential (primary) hypertension: Secondary | ICD-10-CM | POA: Diagnosis not present

## 2019-11-30 DIAGNOSIS — I10 Essential (primary) hypertension: Secondary | ICD-10-CM | POA: Diagnosis not present

## 2019-11-30 DIAGNOSIS — J4520 Mild intermittent asthma, uncomplicated: Secondary | ICD-10-CM | POA: Diagnosis not present

## 2019-11-30 DIAGNOSIS — E870 Hyperosmolality and hypernatremia: Secondary | ICD-10-CM | POA: Diagnosis not present

## 2019-11-30 DIAGNOSIS — Z8616 Personal history of COVID-19: Secondary | ICD-10-CM | POA: Diagnosis not present

## 2019-11-30 DIAGNOSIS — E782 Mixed hyperlipidemia: Secondary | ICD-10-CM | POA: Diagnosis not present

## 2019-11-30 DIAGNOSIS — Z6841 Body Mass Index (BMI) 40.0 and over, adult: Secondary | ICD-10-CM | POA: Diagnosis not present

## 2019-11-30 DIAGNOSIS — E1122 Type 2 diabetes mellitus with diabetic chronic kidney disease: Secondary | ICD-10-CM | POA: Diagnosis not present

## 2019-11-30 DIAGNOSIS — R000 Tachycardia, unspecified: Secondary | ICD-10-CM | POA: Diagnosis not present

## 2019-12-07 DIAGNOSIS — I10 Essential (primary) hypertension: Secondary | ICD-10-CM | POA: Diagnosis not present

## 2019-12-07 DIAGNOSIS — E7849 Other hyperlipidemia: Secondary | ICD-10-CM | POA: Diagnosis not present

## 2020-01-06 DIAGNOSIS — I10 Essential (primary) hypertension: Secondary | ICD-10-CM | POA: Diagnosis not present

## 2020-01-06 DIAGNOSIS — E7849 Other hyperlipidemia: Secondary | ICD-10-CM | POA: Diagnosis not present

## 2020-02-06 DIAGNOSIS — I129 Hypertensive chronic kidney disease with stage 1 through stage 4 chronic kidney disease, or unspecified chronic kidney disease: Secondary | ICD-10-CM | POA: Diagnosis not present

## 2020-02-06 DIAGNOSIS — E7849 Other hyperlipidemia: Secondary | ICD-10-CM | POA: Diagnosis not present

## 2020-02-06 DIAGNOSIS — N183 Chronic kidney disease, stage 3 unspecified: Secondary | ICD-10-CM | POA: Diagnosis not present

## 2020-02-06 DIAGNOSIS — E1122 Type 2 diabetes mellitus with diabetic chronic kidney disease: Secondary | ICD-10-CM | POA: Diagnosis not present

## 2020-02-27 DIAGNOSIS — N183 Chronic kidney disease, stage 3 unspecified: Secondary | ICD-10-CM | POA: Diagnosis not present

## 2020-02-27 DIAGNOSIS — I10 Essential (primary) hypertension: Secondary | ICD-10-CM | POA: Diagnosis not present

## 2020-02-27 DIAGNOSIS — E875 Hyperkalemia: Secondary | ICD-10-CM | POA: Diagnosis not present

## 2020-02-27 DIAGNOSIS — E782 Mixed hyperlipidemia: Secondary | ICD-10-CM | POA: Diagnosis not present

## 2020-02-27 DIAGNOSIS — E870 Hyperosmolality and hypernatremia: Secondary | ICD-10-CM | POA: Diagnosis not present

## 2020-02-27 DIAGNOSIS — E119 Type 2 diabetes mellitus without complications: Secondary | ICD-10-CM | POA: Diagnosis not present

## 2020-03-02 DIAGNOSIS — Z0001 Encounter for general adult medical examination with abnormal findings: Secondary | ICD-10-CM | POA: Diagnosis not present

## 2020-03-02 DIAGNOSIS — M1712 Unilateral primary osteoarthritis, left knee: Secondary | ICD-10-CM | POA: Diagnosis not present

## 2020-03-02 DIAGNOSIS — Z8616 Personal history of COVID-19: Secondary | ICD-10-CM | POA: Diagnosis not present

## 2020-03-02 DIAGNOSIS — E782 Mixed hyperlipidemia: Secondary | ICD-10-CM | POA: Diagnosis not present

## 2020-03-02 DIAGNOSIS — E1122 Type 2 diabetes mellitus with diabetic chronic kidney disease: Secondary | ICD-10-CM | POA: Diagnosis not present

## 2020-03-02 DIAGNOSIS — I10 Essential (primary) hypertension: Secondary | ICD-10-CM | POA: Diagnosis not present

## 2020-03-02 DIAGNOSIS — E870 Hyperosmolality and hypernatremia: Secondary | ICD-10-CM | POA: Diagnosis not present

## 2020-03-02 DIAGNOSIS — J4520 Mild intermittent asthma, uncomplicated: Secondary | ICD-10-CM | POA: Diagnosis not present

## 2020-03-07 DIAGNOSIS — E7849 Other hyperlipidemia: Secondary | ICD-10-CM | POA: Diagnosis not present

## 2020-03-07 DIAGNOSIS — I129 Hypertensive chronic kidney disease with stage 1 through stage 4 chronic kidney disease, or unspecified chronic kidney disease: Secondary | ICD-10-CM | POA: Diagnosis not present

## 2020-03-07 DIAGNOSIS — N183 Chronic kidney disease, stage 3 unspecified: Secondary | ICD-10-CM | POA: Diagnosis not present

## 2020-03-07 DIAGNOSIS — E1122 Type 2 diabetes mellitus with diabetic chronic kidney disease: Secondary | ICD-10-CM | POA: Diagnosis not present

## 2020-03-15 DIAGNOSIS — Z23 Encounter for immunization: Secondary | ICD-10-CM | POA: Diagnosis not present

## 2020-04-12 DIAGNOSIS — Z23 Encounter for immunization: Secondary | ICD-10-CM | POA: Diagnosis not present

## 2020-04-30 DIAGNOSIS — Z20828 Contact with and (suspected) exposure to other viral communicable diseases: Secondary | ICD-10-CM | POA: Diagnosis not present

## 2020-04-30 DIAGNOSIS — J309 Allergic rhinitis, unspecified: Secondary | ICD-10-CM | POA: Diagnosis not present

## 2020-05-08 DIAGNOSIS — I129 Hypertensive chronic kidney disease with stage 1 through stage 4 chronic kidney disease, or unspecified chronic kidney disease: Secondary | ICD-10-CM | POA: Diagnosis not present

## 2020-05-08 DIAGNOSIS — E1122 Type 2 diabetes mellitus with diabetic chronic kidney disease: Secondary | ICD-10-CM | POA: Diagnosis not present

## 2020-05-08 DIAGNOSIS — E7849 Other hyperlipidemia: Secondary | ICD-10-CM | POA: Diagnosis not present

## 2020-05-08 DIAGNOSIS — N183 Chronic kidney disease, stage 3 unspecified: Secondary | ICD-10-CM | POA: Diagnosis not present

## 2020-07-07 DIAGNOSIS — I129 Hypertensive chronic kidney disease with stage 1 through stage 4 chronic kidney disease, or unspecified chronic kidney disease: Secondary | ICD-10-CM | POA: Diagnosis not present

## 2020-07-07 DIAGNOSIS — E1122 Type 2 diabetes mellitus with diabetic chronic kidney disease: Secondary | ICD-10-CM | POA: Diagnosis not present

## 2020-07-07 DIAGNOSIS — N183 Chronic kidney disease, stage 3 unspecified: Secondary | ICD-10-CM | POA: Diagnosis not present

## 2020-08-27 DIAGNOSIS — E870 Hyperosmolality and hypernatremia: Secondary | ICD-10-CM | POA: Diagnosis not present

## 2020-08-27 DIAGNOSIS — I10 Essential (primary) hypertension: Secondary | ICD-10-CM | POA: Diagnosis not present

## 2020-08-27 DIAGNOSIS — N183 Chronic kidney disease, stage 3 unspecified: Secondary | ICD-10-CM | POA: Diagnosis not present

## 2020-08-27 DIAGNOSIS — E1122 Type 2 diabetes mellitus with diabetic chronic kidney disease: Secondary | ICD-10-CM | POA: Diagnosis not present

## 2020-08-27 DIAGNOSIS — R946 Abnormal results of thyroid function studies: Secondary | ICD-10-CM | POA: Diagnosis not present

## 2020-08-27 DIAGNOSIS — E119 Type 2 diabetes mellitus without complications: Secondary | ICD-10-CM | POA: Diagnosis not present

## 2020-08-27 DIAGNOSIS — E782 Mixed hyperlipidemia: Secondary | ICD-10-CM | POA: Diagnosis not present

## 2020-08-30 DIAGNOSIS — E1122 Type 2 diabetes mellitus with diabetic chronic kidney disease: Secondary | ICD-10-CM | POA: Diagnosis not present

## 2020-08-30 DIAGNOSIS — J4520 Mild intermittent asthma, uncomplicated: Secondary | ICD-10-CM | POA: Diagnosis not present

## 2020-08-30 DIAGNOSIS — N182 Chronic kidney disease, stage 2 (mild): Secondary | ICD-10-CM | POA: Diagnosis not present

## 2020-08-30 DIAGNOSIS — I10 Essential (primary) hypertension: Secondary | ICD-10-CM | POA: Diagnosis not present

## 2020-08-30 DIAGNOSIS — M1712 Unilateral primary osteoarthritis, left knee: Secondary | ICD-10-CM | POA: Diagnosis not present

## 2020-08-30 DIAGNOSIS — Z6841 Body Mass Index (BMI) 40.0 and over, adult: Secondary | ICD-10-CM | POA: Diagnosis not present

## 2020-08-30 DIAGNOSIS — E782 Mixed hyperlipidemia: Secondary | ICD-10-CM | POA: Diagnosis not present

## 2020-08-30 DIAGNOSIS — E870 Hyperosmolality and hypernatremia: Secondary | ICD-10-CM | POA: Diagnosis not present

## 2020-09-14 DIAGNOSIS — Z6841 Body Mass Index (BMI) 40.0 and over, adult: Secondary | ICD-10-CM | POA: Diagnosis not present

## 2020-09-14 DIAGNOSIS — I10 Essential (primary) hypertension: Secondary | ICD-10-CM | POA: Diagnosis not present

## 2020-10-03 DIAGNOSIS — Z20828 Contact with and (suspected) exposure to other viral communicable diseases: Secondary | ICD-10-CM | POA: Diagnosis not present

## 2020-10-23 DIAGNOSIS — Z6841 Body Mass Index (BMI) 40.0 and over, adult: Secondary | ICD-10-CM | POA: Diagnosis not present

## 2020-10-23 DIAGNOSIS — M1712 Unilateral primary osteoarthritis, left knee: Secondary | ICD-10-CM | POA: Diagnosis not present

## 2020-12-03 DIAGNOSIS — Z6841 Body Mass Index (BMI) 40.0 and over, adult: Secondary | ICD-10-CM | POA: Diagnosis not present

## 2020-12-03 DIAGNOSIS — M1612 Unilateral primary osteoarthritis, left hip: Secondary | ICD-10-CM | POA: Diagnosis not present

## 2020-12-05 DIAGNOSIS — I129 Hypertensive chronic kidney disease with stage 1 through stage 4 chronic kidney disease, or unspecified chronic kidney disease: Secondary | ICD-10-CM | POA: Diagnosis not present

## 2020-12-05 DIAGNOSIS — E7849 Other hyperlipidemia: Secondary | ICD-10-CM | POA: Diagnosis not present

## 2020-12-05 DIAGNOSIS — E1122 Type 2 diabetes mellitus with diabetic chronic kidney disease: Secondary | ICD-10-CM | POA: Diagnosis not present

## 2020-12-05 DIAGNOSIS — N183 Chronic kidney disease, stage 3 unspecified: Secondary | ICD-10-CM | POA: Diagnosis not present

## 2021-01-05 DIAGNOSIS — I129 Hypertensive chronic kidney disease with stage 1 through stage 4 chronic kidney disease, or unspecified chronic kidney disease: Secondary | ICD-10-CM | POA: Diagnosis not present

## 2021-01-05 DIAGNOSIS — E1122 Type 2 diabetes mellitus with diabetic chronic kidney disease: Secondary | ICD-10-CM | POA: Diagnosis not present

## 2021-01-05 DIAGNOSIS — E7849 Other hyperlipidemia: Secondary | ICD-10-CM | POA: Diagnosis not present

## 2021-01-05 DIAGNOSIS — N183 Chronic kidney disease, stage 3 unspecified: Secondary | ICD-10-CM | POA: Diagnosis not present

## 2021-02-04 DIAGNOSIS — E7849 Other hyperlipidemia: Secondary | ICD-10-CM | POA: Diagnosis not present

## 2021-02-04 DIAGNOSIS — E1122 Type 2 diabetes mellitus with diabetic chronic kidney disease: Secondary | ICD-10-CM | POA: Diagnosis not present

## 2021-02-04 DIAGNOSIS — N183 Chronic kidney disease, stage 3 unspecified: Secondary | ICD-10-CM | POA: Diagnosis not present

## 2021-02-04 DIAGNOSIS — I129 Hypertensive chronic kidney disease with stage 1 through stage 4 chronic kidney disease, or unspecified chronic kidney disease: Secondary | ICD-10-CM | POA: Diagnosis not present

## 2021-02-16 ENCOUNTER — Emergency Department (HOSPITAL_COMMUNITY)
Admission: EM | Admit: 2021-02-16 | Discharge: 2021-02-16 | Disposition: A | Discharge status: Home / Self Care | Payer: Medicare HMO | Attending: Emergency Medicine | Admitting: Emergency Medicine

## 2021-02-16 ENCOUNTER — Other Ambulatory Visit: Payer: Self-pay

## 2021-02-16 ENCOUNTER — Encounter (HOSPITAL_COMMUNITY): Payer: Self-pay | Admitting: *Deleted

## 2021-02-16 ENCOUNTER — Emergency Department (HOSPITAL_COMMUNITY): Payer: Medicare HMO

## 2021-02-16 DIAGNOSIS — Z7951 Long term (current) use of inhaled steroids: Secondary | ICD-10-CM | POA: Diagnosis not present

## 2021-02-16 DIAGNOSIS — Z87891 Personal history of nicotine dependence: Secondary | ICD-10-CM | POA: Insufficient documentation

## 2021-02-16 DIAGNOSIS — I10 Essential (primary) hypertension: Secondary | ICD-10-CM | POA: Diagnosis not present

## 2021-02-16 DIAGNOSIS — M1711 Unilateral primary osteoarthritis, right knee: Secondary | ICD-10-CM | POA: Diagnosis not present

## 2021-02-16 DIAGNOSIS — M25561 Pain in right knee: Secondary | ICD-10-CM

## 2021-02-16 DIAGNOSIS — Z79899 Other long term (current) drug therapy: Secondary | ICD-10-CM | POA: Insufficient documentation

## 2021-02-16 DIAGNOSIS — R000 Tachycardia, unspecified: Secondary | ICD-10-CM | POA: Diagnosis not present

## 2021-02-16 DIAGNOSIS — J4520 Mild intermittent asthma, uncomplicated: Secondary | ICD-10-CM | POA: Diagnosis not present

## 2021-02-16 MED ORDER — HYDROCODONE-ACETAMINOPHEN 5-325 MG PO TABS
1.0000 | ORAL_TABLET | Freq: Once | ORAL | Status: DC
  Filled 2021-02-16: qty 1

## 2021-02-16 MED ORDER — ACETAMINOPHEN 500 MG PO TABS
1000.0000 mg | ORAL_TABLET | Freq: Once | ORAL | Status: AC
  Administered 2021-02-16: 1000 mg via ORAL
  Filled 2021-02-16: qty 2

## 2021-02-16 NOTE — Discharge Instructions (Addendum)
Please follow up with Dr. [facility] for further evaluation of your knee pain/arthritis  You can apply OTC Voltaren gel to help with pain. Continue taking Tylenol as needed for pain. Rest, ice, and elevation can also help.   Return to the ED for any new/worsening symptoms

## 2021-02-16 NOTE — ED Notes (Signed)
Pt sitting in chair, pt c/o R knee pain, states that she thinks that it is her arthritis, pt states that she doesn't need anything at this time, states that she doesn't need an ice pack, resps even and unlabored.

## 2021-02-16 NOTE — ED Triage Notes (Signed)
Pt with right knee pain that is chronic with known arthritis but past 2 weeks pain is worse and is "stiff" per pt.

## 2021-02-16 NOTE — ED Notes (Signed)
Went to give pt pain medication, pt states that she is driving home, PA notified, hold vicodin.

## 2021-02-16 NOTE — ED Provider Notes (Signed)
[facility] EMERGENCY DEPARTMENT Provider Note   CSN: [id number] Arrival date & time: 02/16/21  1058     History Chief Complaint  Patient presents with   Knee Pain    Allison Bates is a 75 y.o. female with Pmhx HTN, high cholesterol, and arthritis who presents to the ED today with complaint of gradual onset, constant, achy/stiff, right knee pain for the past 2 weeks. No trauma to the knee. Pt reports intermittent issues with her knees in the past and was told by her PCP that she has arthritis in her knees. She has been taking Tylenol Arthritis without much relief. Pt reports the pain is mostly present when she tries to stand up from a seated or laying down position and feels like her knee is stiff at that time. She was busy this past week and unable to follow up with her PCP prompting ED visit today. She has never seen an orthopedist for her knee pain. Never received steroid injections into the knee. Denies fevers, chills, swelling, redness, warmth, or any other associated symptoms.   The history is provided by the patient and medical records.      Past Medical History:  Diagnosis Date   Arthritis    High cholesterol    Hypertension     Patient Active Problem List   Diagnosis Date Noted   Allergic rhinitis due to allergen 07/29/2016   Chronic urticaria 07/29/2016   Mild intermittent asthma, uncomplicated 07/29/2016    Past Surgical History:  Procedure Laterality Date   ABDOMINAL HYSTERECTOMY       OB History   No obstetric history on file.     Family History  Problem Relation Age of Onset   Allergic rhinitis Neg Hx    Angioedema Neg Hx    Asthma Neg Hx    Atopy Neg Hx    Eczema Neg Hx    Immunodeficiency Neg Hx    Urticaria Neg Hx     Social History   Tobacco Use   Smoking status: Former    Pack years: 0.00   Smokeless tobacco: Never  Substance Use Topics   Alcohol use: No   Drug use: No    Home Medications Prior to Admission medications    Medication Sig Start Date End Date Taking? Authorizing Provider  amLODipine (NORVASC) 10 MG tablet Take 10 mg by mouth daily.  07/16/16   [provider]  atorvastatin (LIPITOR) 20 MG tablet Take 1 tablet by mouth daily. 11/13/18   [provider]  fluticasone (FLONASE) 50 MCG/ACT nasal spray Place 2 sprays into both nostrils daily. 07/29/16   Alfonse Spruce, MD  HYDROcodone-acetaminophen (NORCO/VICODIN) 5-325 MG tablet Take 1 tablet by mouth every 4 (four) hours as needed. Patient not taking: Reported on 12/22/2018 09/16/16   Ivery Quale, PA-C  ibuprofen (ADVIL,MOTRIN) 400 MG tablet Take 1 tablet (400 mg total) by mouth 4 (four) times daily. Patient not taking: Reported on 12/22/2018 09/16/16   Ivery Quale, PA-C  montelukast (SINGULAIR) 10 MG tablet Take 1 tablet (10 mg total) by mouth at bedtime. 07/29/16   Alfonse Spruce, MD  ondansetron (ZOFRAN) 4 MG tablet Take 1 tablet (4 mg total) by mouth every 8 (eight) hours as needed. 09/30/19   Devoria Albe, MD  pantoprazole (PROTONIX) 20 MG tablet Take 2 tablets (40 mg total) by mouth daily for 30 days. 12/22/18 01/21/19  Long, Arlyss Repress, MD  sucralfate (CARAFATE) 1 g tablet Take 1 tablet (1 g total) by  mouth 4 (four) times daily -  with meals and at bedtime for 14 days. 12/22/18 01/05/19  Long, Arlyss Repress, MD  valsartan-hydrochlorothiazide (DIOVAN-HCT) 320-25 MG tablet Take 1 tablet by mouth daily.  06/14/16   [provider]    Allergies    Contrast media [iodinated diagnostic agents]  Review of Systems   Review of Systems  Constitutional:  Negative for fever.  Musculoskeletal:  Positive for arthralgias. Negative for joint swelling.  Skin:  Negative for color change.  Neurological:  Negative for weakness and numbness.  All other systems reviewed and are negative.  Physical Exam Updated Vital Signs BP (!) 158/101 (BP Location: Right Arm)   Pulse (!) 117   Temp 97.9 F (36.6 C) (Temporal)   Resp 12   Ht 5\' 3"   (1.6 m)   Wt 103 kg   SpO2 96%   BMI 40.21 kg/m   Physical Exam Vitals and nursing note reviewed.  Constitutional:      Appearance: She is not ill-appearing.  HENT:     Head: Normocephalic and atraumatic.  Eyes:     Conjunctiva/sclera: Conjunctivae normal.  Cardiovascular:     Rate and Rhythm: Normal rate and regular rhythm.     Pulses: Normal pulses.  Pulmonary:     Effort: Pulmonary effort is normal.     Breath sounds: Normal breath sounds. No wheezing, rhonchi or rales.  Musculoskeletal:     Comments: No overlying skin changes to R knee including erythema or increased warmth. No swelling compared to L knee. No obvious TTP currently. ROM intact with crepitus appreciated with flexion/extension. Strength 5/5 and sensation intact throughout. 2+ PT pulse.   Skin:    General: Skin is warm and dry.     Coloration: Skin is not jaundiced.  Neurological:     Mental Status: She is alert.    ED Results / Procedures / Treatments   Labs (all labs ordered are listed, but only abnormal results are displayed) Labs Reviewed - No data to display  EKG None  Radiology DG Knee Complete 4 Views Right  Result Date: 02/16/2021 CLINICAL DATA:  Knee pain EXAM: RIGHT KNEE - COMPLETE 4+ VIEW COMPARISON:  None. FINDINGS: Osteopenia. No acute fracture or dislocation. There are moderate tricompartmental degenerative changes. No area of erosion or osseous destruction. No unexpected radiopaque foreign body. Soft tissues are unremarkable. IMPRESSION: Moderate tricompartmental degenerative changes. Electronically Signed   By: 04/18/2021 MD   On: 02/16/2021 12:29    Procedures Procedures   Medications Ordered in ED Medications  acetaminophen (TYLENOL) tablet 1,000 mg (1,000 mg Oral Given 02/16/21 1257)    ED Course  I have reviewed the triage vital signs and the nursing notes.  Pertinent labs & imaging results that were available during my care of the patient were reviewed by me and  considered in my medical decision making (see chart for details).    MDM Rules/Calculators/A&P                          75 year old female who presents to the ED today with complaint of atraumatic right knee pain for the past 2 weeks.  She reports she has been told that she has arthritis in the past however is never bothered at this much.  No relief with over-the-counter meds.  On arrival to the ED today patient is afebrile.  She is tachycardic at 117, nontachypneic, nontoxic-appearing.  Plan to repeat heart rate however suspect this  is likely mostly secondary to pain.  Treat pain and repeat.  On my exam no overlying skin changes including erythema or increased warmth.  Patient has full range of motion of her knee, doubt septic arthritis today.  Exam findings not consistent with gout.  With flexion and extension of the knee she does have crepitus consistent with likely bone-on-bone.  We will plan for x-ray at this time for further evaluation however patient will likely need to follow-up with orthopedist and may benefit from corticosteroid injection into the knee.    Xray positive for tricompartmental osteoarthritis which is likely the cause of pt's pain.   Will discharge pt home at this time with orthopedic follow up. Pt instructed on OTC Voltaren and Tylenol for pain. Pt in agreement with plan and stable for discharge home.   This note was prepared using Dragon voice recognition software and may include unintentional dictation errors due to the inherent limitations of voice recognition software.  Final Clinical Impression(s) / ED Diagnoses Final diagnoses:  Acute pain of right knee  Osteoarthritis of right knee, unspecified osteoarthritis type    Rx / DC Orders ED Discharge Orders     None        Discharge Instructions      Please follow up with Dr. [facility] for further evaluation of your knee pain/arthritis  You can apply OTC Voltaren gel to help with pain. Continue  taking Tylenol as needed for pain. Rest, ice, and elevation can also help.   Return to the ED for any new/worsening symptoms       Tanda Rockers, Cordelia Poche 02/16/21 1317    Terrilee Files, MD 02/16/21 1800

## 2021-02-28 DIAGNOSIS — E1122 Type 2 diabetes mellitus with diabetic chronic kidney disease: Secondary | ICD-10-CM | POA: Diagnosis not present

## 2021-02-28 DIAGNOSIS — E782 Mixed hyperlipidemia: Secondary | ICD-10-CM | POA: Diagnosis not present

## 2021-02-28 DIAGNOSIS — N183 Chronic kidney disease, stage 3 unspecified: Secondary | ICD-10-CM | POA: Diagnosis not present

## 2021-02-28 DIAGNOSIS — E559 Vitamin D deficiency, unspecified: Secondary | ICD-10-CM | POA: Diagnosis not present

## 2021-02-28 DIAGNOSIS — E7849 Other hyperlipidemia: Secondary | ICD-10-CM | POA: Diagnosis not present

## 2021-03-04 ENCOUNTER — Other Ambulatory Visit (HOSPITAL_COMMUNITY): Payer: Self-pay | Admitting: Family Medicine

## 2021-03-04 DIAGNOSIS — E870 Hyperosmolality and hypernatremia: Secondary | ICD-10-CM | POA: Diagnosis not present

## 2021-03-04 DIAGNOSIS — R61 Generalized hyperhidrosis: Secondary | ICD-10-CM | POA: Diagnosis not present

## 2021-03-04 DIAGNOSIS — Z1231 Encounter for screening mammogram for malignant neoplasm of breast: Secondary | ICD-10-CM

## 2021-03-04 DIAGNOSIS — E7849 Other hyperlipidemia: Secondary | ICD-10-CM | POA: Diagnosis not present

## 2021-03-04 DIAGNOSIS — M1612 Unilateral primary osteoarthritis, left hip: Secondary | ICD-10-CM | POA: Diagnosis not present

## 2021-03-04 DIAGNOSIS — Z0001 Encounter for general adult medical examination with abnormal findings: Secondary | ICD-10-CM | POA: Diagnosis not present

## 2021-03-04 DIAGNOSIS — J4520 Mild intermittent asthma, uncomplicated: Secondary | ICD-10-CM | POA: Diagnosis not present

## 2021-03-04 DIAGNOSIS — E1122 Type 2 diabetes mellitus with diabetic chronic kidney disease: Secondary | ICD-10-CM | POA: Diagnosis not present

## 2021-03-04 DIAGNOSIS — I10 Essential (primary) hypertension: Secondary | ICD-10-CM | POA: Diagnosis not present

## 2021-04-07 DIAGNOSIS — I129 Hypertensive chronic kidney disease with stage 1 through stage 4 chronic kidney disease, or unspecified chronic kidney disease: Secondary | ICD-10-CM | POA: Diagnosis not present

## 2021-04-07 DIAGNOSIS — E7849 Other hyperlipidemia: Secondary | ICD-10-CM | POA: Diagnosis not present

## 2021-04-07 DIAGNOSIS — N183 Chronic kidney disease, stage 3 unspecified: Secondary | ICD-10-CM | POA: Diagnosis not present

## 2021-04-07 DIAGNOSIS — E1122 Type 2 diabetes mellitus with diabetic chronic kidney disease: Secondary | ICD-10-CM | POA: Diagnosis not present

## 2021-05-05 IMAGING — MG DIGITAL SCREENING BILATERAL MAMMOGRAM WITH TOMO AND CAD
8 series · 8 of 24 positions shown · non-contrast
Comparison: Previous exam(s).

CLINICAL DATA: Screening.

EXAM:
DIGITAL SCREENING BILATERAL MAMMOGRAM WITH TOMO AND CAD

[R CC synth-2D]
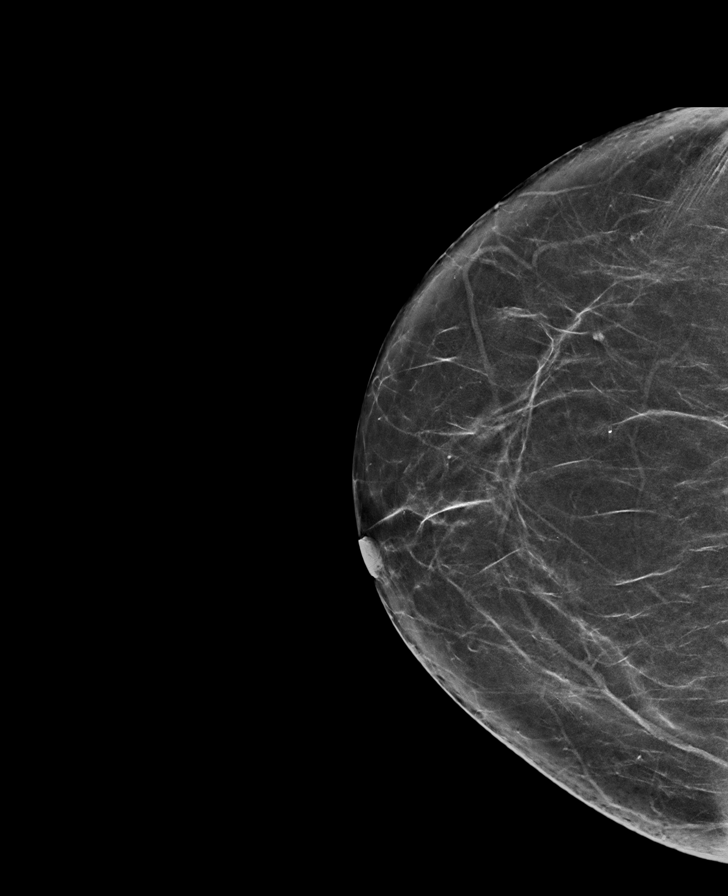

[L MLO synth-2D]
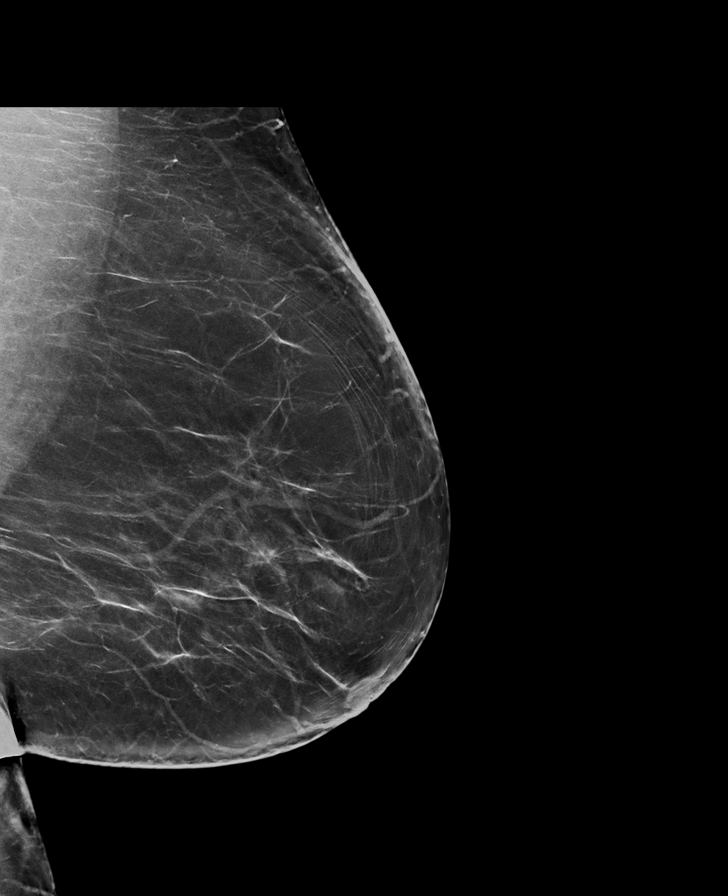

[L CC synth-2D]
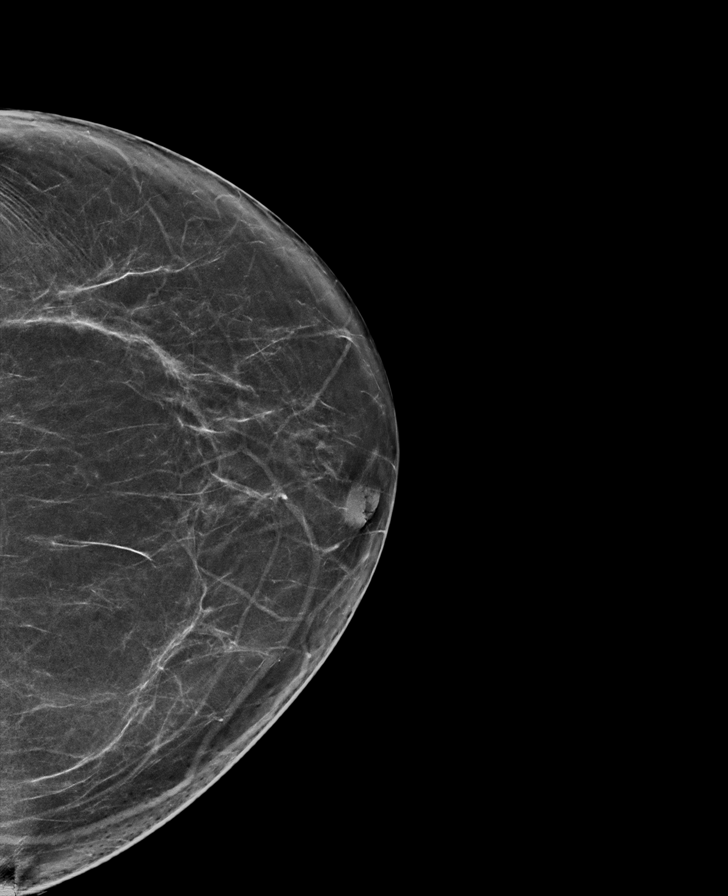

[R MLO synth-2D]
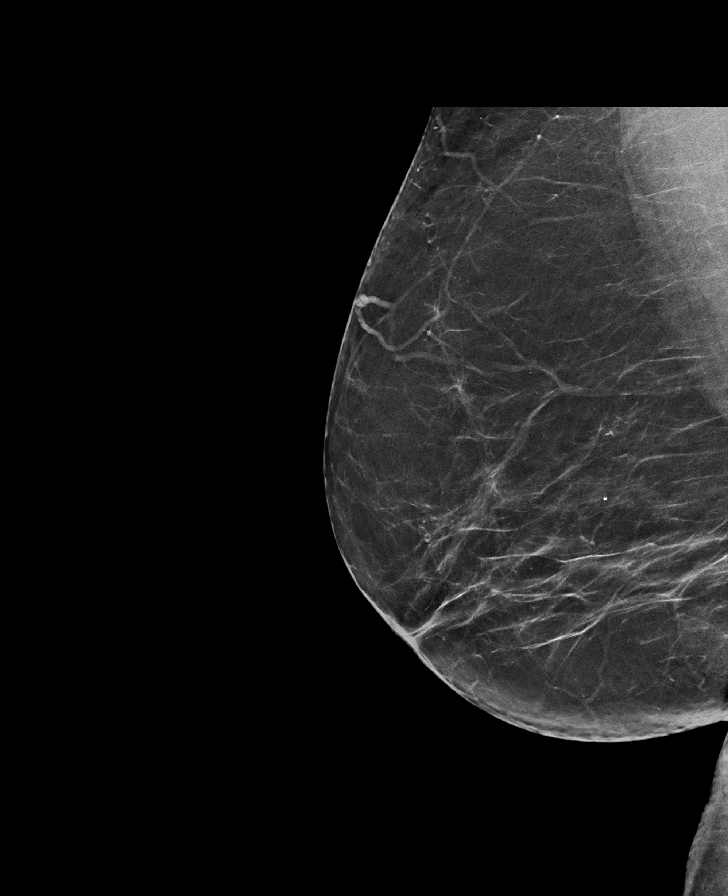

[R CC tomo · tomo slice 35/69.0]
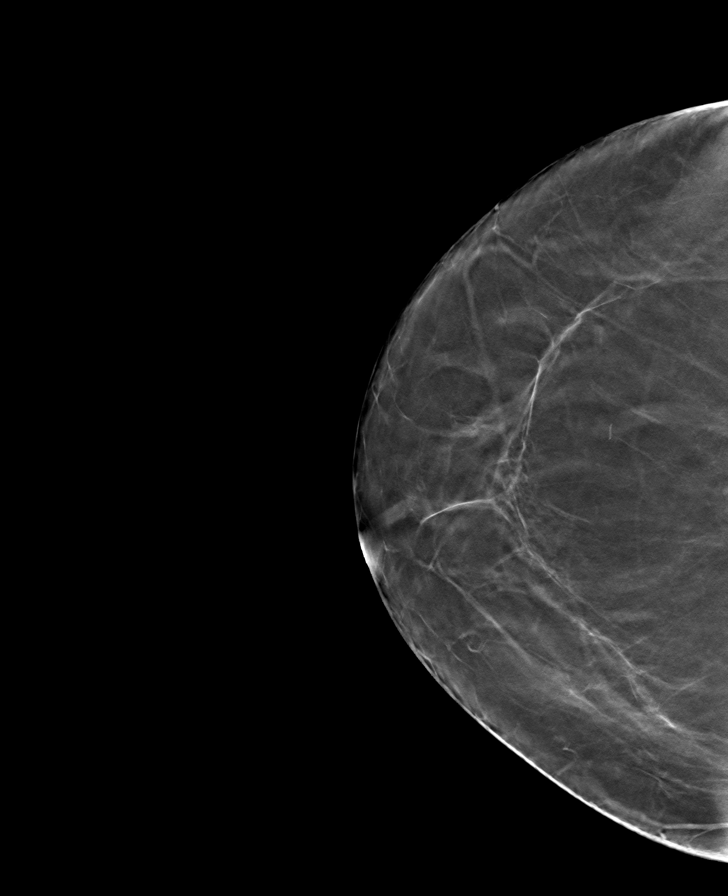

[L CC tomo · tomo slice 35/69.0]
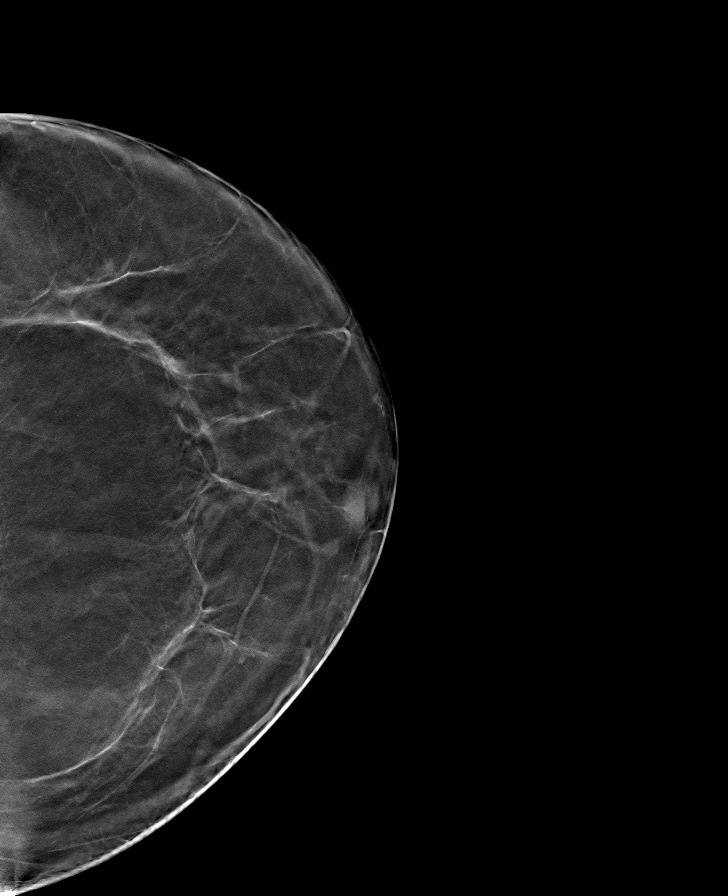

[R MLO tomo · tomo slice 37/72.0]
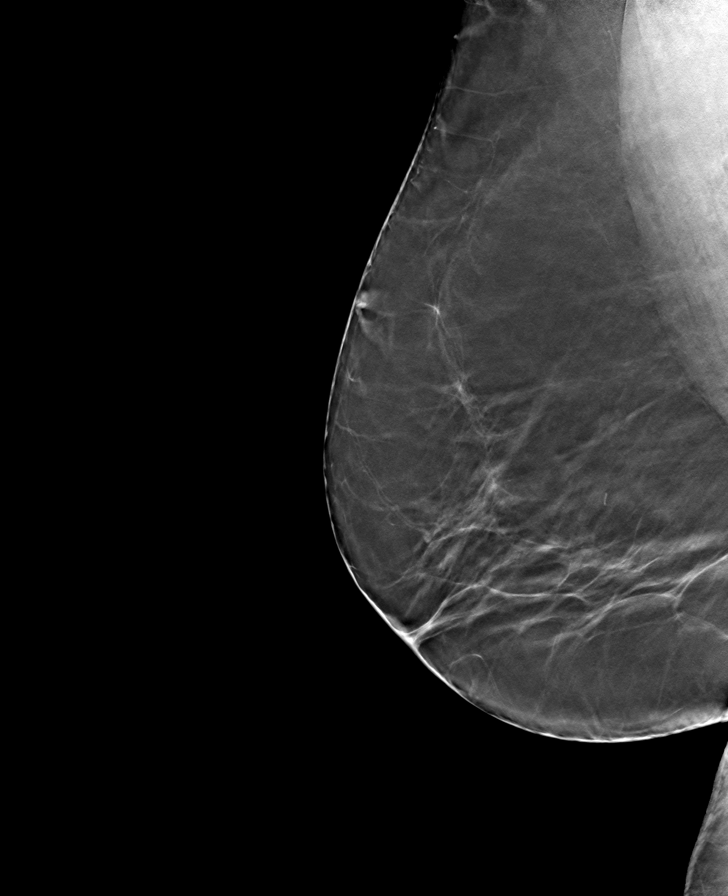

[L MLO tomo · tomo slice 41/81.0]
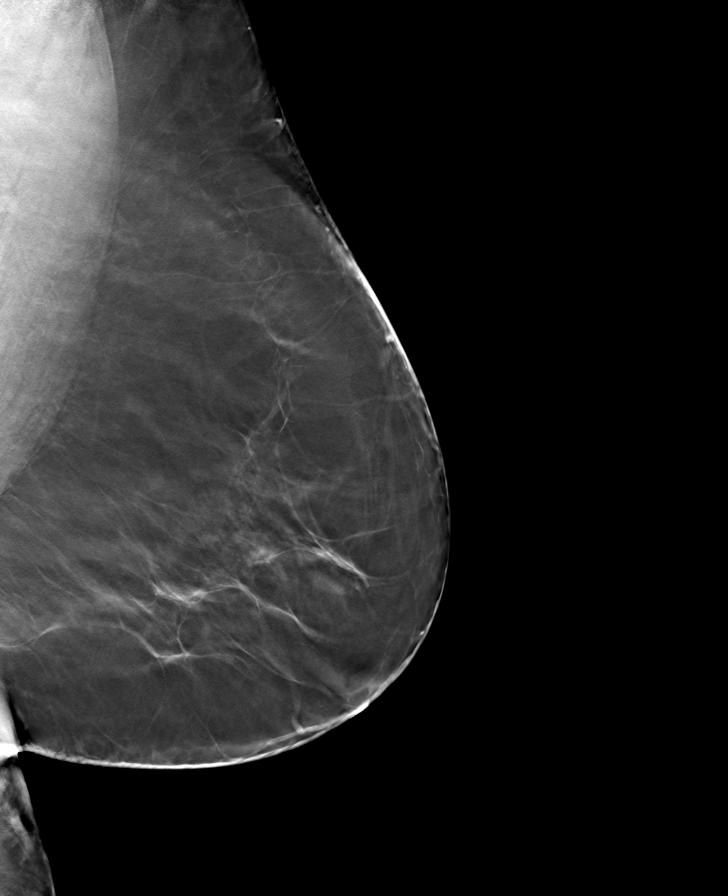

[8 of 24 positions shown; findings below may reference images not displayed]

ACR Breast Density Category b: There are scattered areas of
fibroglandular density.
FINDINGS: There are no findings suspicious for malignancy. Images were
processed with CAD.
IMPRESSION: No mammographic evidence of malignancy. A result letter of this
screening mammogram will be mailed directly to the patient.

RECOMMENDATION:
Screening mammogram in one year. (Code:CN-U-775)

BI-RADS CATEGORY  1: Negative.

## 2021-05-31 DIAGNOSIS — I10 Essential (primary) hypertension: Secondary | ICD-10-CM | POA: Diagnosis not present

## 2021-05-31 DIAGNOSIS — E7849 Other hyperlipidemia: Secondary | ICD-10-CM | POA: Diagnosis not present

## 2021-05-31 DIAGNOSIS — E875 Hyperkalemia: Secondary | ICD-10-CM | POA: Diagnosis not present

## 2021-05-31 DIAGNOSIS — E782 Mixed hyperlipidemia: Secondary | ICD-10-CM | POA: Diagnosis not present

## 2021-05-31 DIAGNOSIS — E119 Type 2 diabetes mellitus without complications: Secondary | ICD-10-CM | POA: Diagnosis not present

## 2021-05-31 DIAGNOSIS — N183 Chronic kidney disease, stage 3 unspecified: Secondary | ICD-10-CM | POA: Diagnosis not present

## 2021-06-03 DIAGNOSIS — E1122 Type 2 diabetes mellitus with diabetic chronic kidney disease: Secondary | ICD-10-CM | POA: Diagnosis not present

## 2021-06-03 DIAGNOSIS — M1612 Unilateral primary osteoarthritis, left hip: Secondary | ICD-10-CM | POA: Diagnosis not present

## 2021-06-03 DIAGNOSIS — N183 Chronic kidney disease, stage 3 unspecified: Secondary | ICD-10-CM | POA: Diagnosis not present

## 2021-06-03 DIAGNOSIS — E870 Hyperosmolality and hypernatremia: Secondary | ICD-10-CM | POA: Diagnosis not present

## 2021-06-03 DIAGNOSIS — M109 Gout, unspecified: Secondary | ICD-10-CM | POA: Diagnosis not present

## 2021-06-03 DIAGNOSIS — F4321 Adjustment disorder with depressed mood: Secondary | ICD-10-CM | POA: Diagnosis not present

## 2021-06-03 DIAGNOSIS — Z6841 Body Mass Index (BMI) 40.0 and over, adult: Secondary | ICD-10-CM | POA: Diagnosis not present

## 2021-06-03 DIAGNOSIS — E7849 Other hyperlipidemia: Secondary | ICD-10-CM | POA: Diagnosis not present

## 2021-06-24 ENCOUNTER — Encounter (HOSPITAL_COMMUNITY): Payer: Self-pay

## 2021-06-24 ENCOUNTER — Emergency Department (HOSPITAL_COMMUNITY): Payer: Medicare HMO

## 2021-06-24 ENCOUNTER — Emergency Department (HOSPITAL_COMMUNITY)
Admission: EM | Admit: 2021-06-24 | Discharge: 2021-06-24 | Disposition: A | Discharge status: Home / Self Care | Payer: Medicare HMO | Attending: Emergency Medicine | Admitting: Emergency Medicine

## 2021-06-24 ENCOUNTER — Other Ambulatory Visit: Payer: Self-pay

## 2021-06-24 DIAGNOSIS — I10 Essential (primary) hypertension: Secondary | ICD-10-CM | POA: Insufficient documentation

## 2021-06-24 DIAGNOSIS — M109 Gout, unspecified: Secondary | ICD-10-CM | POA: Diagnosis not present

## 2021-06-24 DIAGNOSIS — M79672 Pain in left foot: Secondary | ICD-10-CM

## 2021-06-24 DIAGNOSIS — J45909 Unspecified asthma, uncomplicated: Secondary | ICD-10-CM | POA: Insufficient documentation

## 2021-06-24 DIAGNOSIS — Z87891 Personal history of nicotine dependence: Secondary | ICD-10-CM | POA: Insufficient documentation

## 2021-06-24 DIAGNOSIS — Z79899 Other long term (current) drug therapy: Secondary | ICD-10-CM | POA: Insufficient documentation

## 2021-06-24 DIAGNOSIS — M10072 Idiopathic gout, left ankle and foot: Secondary | ICD-10-CM | POA: Insufficient documentation

## 2021-06-24 MED ORDER — NAPROXEN SODIUM 220 MG PO TABS: 220.0000 mg | ORAL_TABLET | Freq: Two times a day (BID) | ORAL | 0 refills | Status: AC

## 2021-06-24 NOTE — ED Triage Notes (Signed)
Pt presents to ED with complaints of left foot pain x 1 week, denies injury.

## 2021-06-24 NOTE — ED Provider Notes (Signed)
[facility] EMERGENCY DEPARTMENT Provider Note   CSN: [id number] Arrival date & time: 06/24/21  0941     History Chief Complaint  Patient presents with   Foot Pain    Allison Bates is a 74 y.o. female.  This is a 75 y.o. female with significant medical history as below, including HTN, HLD, arthritis who presents to the ED with complaint of left foot pain/toe pain. Pt was diagnosed with possible gout 1 mos ago and was given a shot at that time which mildly improved pain but the symptoms returned after eating [employer] chicken.   Location:  left foot/toe Duration:  1-2 mos ago Onset:  gradual Timing:  constant Description:  aching, sharp at times Severity:  mild Exacerbating/Alleviating Factors:  worse with ambulation, palpation Associated Symptoms:  none Pertinent Negatives:  no fevers, swelling, no rashes, no falls or injuries, no surgical history to foot, no systemic complaints   The history is provided by the patient. No language interpreter was used.  Foot Pain The current episode started more than 1 week ago. The problem has not changed since onset.Pertinent negatives include no chest pain, no abdominal pain, no headaches and no shortness of breath.      Past Medical History:  Diagnosis Date   Arthritis    High cholesterol    Hypertension     Patient Active Problem List   Diagnosis Date Noted   Allergic rhinitis due to allergen 07/29/2016   Chronic urticaria 07/29/2016   Mild intermittent asthma, uncomplicated 07/29/2016    Past Surgical History:  Procedure Laterality Date   ABDOMINAL HYSTERECTOMY       OB History   No obstetric history on file.     Family History  Problem Relation Age of Onset   Allergic rhinitis Neg Hx    Angioedema Neg Hx    Asthma Neg Hx    Atopy Neg Hx    Eczema Neg Hx    Immunodeficiency Neg Hx    Urticaria Neg Hx     Social History   Tobacco Use   Smoking status: Former   Smokeless tobacco: Never  Substance  Use Topics   Alcohol use: No   Drug use: No    Home Medications Prior to Admission medications   Medication Sig Start Date End Date Taking? Authorizing Provider  naproxen sodium (ALEVE) 220 MG tablet Take 1 tablet (220 mg total) by mouth 2 (two) times daily with a meal for 7 days. 06/24/21 07/01/21 Yes Tanda Rockers A, DO  amLODipine (NORVASC) 10 MG tablet Take 10 mg by mouth daily.  07/16/16   [provider]  atorvastatin (LIPITOR) 20 MG tablet Take 1 tablet by mouth daily. 11/13/18   [provider]  fluticasone (FLONASE) 50 MCG/ACT nasal spray Place 2 sprays into both nostrils daily. 07/29/16   Alfonse Spruce, MD  HYDROcodone-acetaminophen (NORCO/VICODIN) 5-325 MG tablet Take 1 tablet by mouth every 4 (four) hours as needed. Patient not taking: Reported on 12/22/2018 09/16/16   Ivery Quale, PA-C  montelukast (SINGULAIR) 10 MG tablet Take 1 tablet (10 mg total) by mouth at bedtime. 07/29/16   Alfonse Spruce, MD  ondansetron (ZOFRAN) 4 MG tablet Take 1 tablet (4 mg total) by mouth every 8 (eight) hours as needed. 09/30/19   Devoria Albe, MD  pantoprazole (PROTONIX) 20 MG tablet Take 2 tablets (40 mg total) by mouth daily for 30 days. 12/22/18 01/21/19  Long, Arlyss Repress, MD  sucralfate (CARAFATE) 1 g tablet Take  1 tablet (1 g total) by mouth 4 (four) times daily -  with meals and at bedtime for 14 days. 12/22/18 01/05/19  Long, Arlyss Repress, MD  valsartan-hydrochlorothiazide (DIOVAN-HCT) 320-25 MG tablet Take 1 tablet by mouth daily.  06/14/16   [provider]    Allergies    Contrast media [iodinated diagnostic agents]  Review of Systems   Review of Systems  Constitutional:  Negative for chills and fever.  HENT:  Negative for facial swelling and trouble swallowing.   Eyes:  Negative for photophobia and visual disturbance.  Respiratory:  Negative for cough and shortness of breath.   Cardiovascular:  Negative for chest pain and palpitations.  Gastrointestinal:   Negative for abdominal pain, nausea and vomiting.  Endocrine: Negative for polydipsia and polyuria.  Genitourinary:  Negative for difficulty urinating and hematuria.  Musculoskeletal:  Positive for arthralgias. Negative for gait problem and joint swelling.  Skin:  Negative for pallor and rash.  Neurological:  Negative for syncope and headaches.  Psychiatric/Behavioral:  Negative for agitation and confusion.    Physical Exam Updated Vital Signs BP (!) 176/95 (BP Location: Right Arm)   Pulse (!) 110   Temp 98 F (36.7 C) (Oral)   Resp 18   Ht 5\' 2"  (1.575 m)   Wt 104.3 kg   SpO2 94%   BMI 42.07 kg/m   Physical Exam Vitals and nursing note reviewed.  Constitutional:      General: She is not in acute distress.    Appearance: Normal appearance.  HENT:     Head: Normocephalic and atraumatic.     Right Ear: External ear normal.     Left Ear: External ear normal.     Nose: Nose normal.     Mouth/Throat:     Mouth: Mucous membranes are moist.  Eyes:     General: No scleral icterus.       Right eye: No discharge.        Left eye: No discharge.  Cardiovascular:     Rate and Rhythm: Normal rate and regular rhythm.     Pulses: Normal pulses.     Heart sounds: Normal heart sounds.  Pulmonary:     Effort: Pulmonary effort is normal. No respiratory distress.     Breath sounds: Normal breath sounds.  Abdominal:     General: Abdomen is flat.     Tenderness: There is no abdominal tenderness.  Musculoskeletal:        General: Normal range of motion.     Cervical back: Normal range of motion.     Right lower leg: No edema.     Left lower leg: No edema.  Feet:     Comments: Possible podagra, no rash, 2+ dp pulses, no ankle swelling. No bruising  Skin:    General: Skin is warm and dry.     Capillary Refill: Capillary refill takes less than 2 seconds.  Neurological:     Mental Status: She is alert.  Psychiatric:        Mood and Affect: Mood normal.        Behavior: Behavior  normal.    ED Results / Procedures / Treatments   Labs (all labs ordered are listed, but only abnormal results are displayed) Labs Reviewed - No data to display  EKG None  Radiology DG Foot Complete Left  Result Date: 06/24/2021 CLINICAL DATA:  LEFT foot pain EXAM: LEFT FOOT - COMPLETE 3+ VIEW COMPARISON:  None. FINDINGS: No fracture or dislocation  of mid foot or forefoot. The phalanges are normal. The calcaneus is normal. No soft tissue abnormality. Enthesopathic spurring along the plantar aspect of the calcaneus. IMPRESSION: No acute osseous abnormality. Electronically Signed   By: Genevive Bi M.D.   On: 06/24/2021 11:11    Procedures Procedures   Medications Ordered in ED Medications - No data to display  ED Course  I have reviewed the triage vital signs and the nursing notes.  Pertinent labs & imaging results that were available during my care of the patient were reviewed by me and considered in my medical decision making (see chart for details).    MDM Rules/Calculators/A&P                           This patient complains of foot pain; this involves an extensive number of treatment Options and is a complaint that carries with it a high risk of complications and Morbidity. Serious etiologies considered.   I ordered imaging studies which included foot xr and I independently visualized and interpreted imaging which showed non acute    Previous records obtained and reviewed   Pt with likely gout to left toe, discussed dietary recommendations, NSAID's, follow up with pcp/podiatrist for further testing/follow up care  The patient improved significantly and was discharged in stable condition. Detailed discussions were had with the patient regarding current findings, and need for close f/u with PCP or on call doctor. The patient has been instructed to return immediately if the symptoms worsen in any way for re-evaluation. Patient verbalized understanding and is in  agreement with current care plan. All questions answered prior to discharge.   Final Clinical Impression(s) / ED Diagnoses Final diagnoses:  Left foot pain  Gout involving toe of left foot, unspecified cause, unspecified chronicity    Rx / DC Orders ED Discharge Orders          Ordered    naproxen sodium (ALEVE) 220 MG tablet  2 times daily with meals        06/24/21 1312             Sloan Leiter, DO 06/25/21 [phone]

## 2021-07-01 DIAGNOSIS — Z6841 Body Mass Index (BMI) 40.0 and over, adult: Secondary | ICD-10-CM | POA: Diagnosis not present

## 2021-07-01 DIAGNOSIS — M19072 Primary osteoarthritis, left ankle and foot: Secondary | ICD-10-CM | POA: Diagnosis not present

## 2021-08-07 DIAGNOSIS — E7849 Other hyperlipidemia: Secondary | ICD-10-CM | POA: Diagnosis not present

## 2021-08-07 DIAGNOSIS — I129 Hypertensive chronic kidney disease with stage 1 through stage 4 chronic kidney disease, or unspecified chronic kidney disease: Secondary | ICD-10-CM | POA: Diagnosis not present

## 2021-08-07 DIAGNOSIS — E1122 Type 2 diabetes mellitus with diabetic chronic kidney disease: Secondary | ICD-10-CM | POA: Diagnosis not present

## 2021-08-07 DIAGNOSIS — N183 Chronic kidney disease, stage 3 unspecified: Secondary | ICD-10-CM | POA: Diagnosis not present

## 2021-08-27 DIAGNOSIS — M109 Gout, unspecified: Secondary | ICD-10-CM | POA: Diagnosis not present

## 2021-08-27 DIAGNOSIS — E782 Mixed hyperlipidemia: Secondary | ICD-10-CM | POA: Diagnosis not present

## 2021-08-27 DIAGNOSIS — E1165 Type 2 diabetes mellitus with hyperglycemia: Secondary | ICD-10-CM | POA: Diagnosis not present

## 2021-08-27 DIAGNOSIS — E7849 Other hyperlipidemia: Secondary | ICD-10-CM | POA: Diagnosis not present

## 2021-08-27 DIAGNOSIS — D649 Anemia, unspecified: Secondary | ICD-10-CM | POA: Diagnosis not present

## 2021-08-27 DIAGNOSIS — E875 Hyperkalemia: Secondary | ICD-10-CM | POA: Diagnosis not present

## 2021-08-27 DIAGNOSIS — D529 Folate deficiency anemia, unspecified: Secondary | ICD-10-CM | POA: Diagnosis not present

## 2021-08-27 DIAGNOSIS — D519 Vitamin B12 deficiency anemia, unspecified: Secondary | ICD-10-CM | POA: Diagnosis not present

## 2021-08-27 DIAGNOSIS — I10 Essential (primary) hypertension: Secondary | ICD-10-CM | POA: Diagnosis not present

## 2021-08-27 DIAGNOSIS — N183 Chronic kidney disease, stage 3 unspecified: Secondary | ICD-10-CM | POA: Diagnosis not present

## 2021-09-04 DIAGNOSIS — E1122 Type 2 diabetes mellitus with diabetic chronic kidney disease: Secondary | ICD-10-CM | POA: Diagnosis not present

## 2021-09-04 DIAGNOSIS — Z0001 Encounter for general adult medical examination with abnormal findings: Secondary | ICD-10-CM | POA: Diagnosis not present

## 2021-09-04 DIAGNOSIS — Z6841 Body Mass Index (BMI) 40.0 and over, adult: Secondary | ICD-10-CM | POA: Diagnosis not present

## 2021-09-04 DIAGNOSIS — E7849 Other hyperlipidemia: Secondary | ICD-10-CM | POA: Diagnosis not present

## 2021-09-04 DIAGNOSIS — E870 Hyperosmolality and hypernatremia: Secondary | ICD-10-CM | POA: Diagnosis not present

## 2021-11-05 DIAGNOSIS — E1122 Type 2 diabetes mellitus with diabetic chronic kidney disease: Secondary | ICD-10-CM | POA: Diagnosis not present

## 2021-11-05 DIAGNOSIS — I129 Hypertensive chronic kidney disease with stage 1 through stage 4 chronic kidney disease, or unspecified chronic kidney disease: Secondary | ICD-10-CM | POA: Diagnosis not present

## 2021-12-02 DIAGNOSIS — E7849 Other hyperlipidemia: Secondary | ICD-10-CM | POA: Diagnosis not present

## 2021-12-02 DIAGNOSIS — I10 Essential (primary) hypertension: Secondary | ICD-10-CM | POA: Diagnosis not present

## 2021-12-02 DIAGNOSIS — E1165 Type 2 diabetes mellitus with hyperglycemia: Secondary | ICD-10-CM | POA: Diagnosis not present

## 2021-12-02 DIAGNOSIS — E782 Mixed hyperlipidemia: Secondary | ICD-10-CM | POA: Diagnosis not present

## 2021-12-02 DIAGNOSIS — N189 Chronic kidney disease, unspecified: Secondary | ICD-10-CM | POA: Diagnosis not present

## 2021-12-02 DIAGNOSIS — N183 Chronic kidney disease, stage 3 unspecified: Secondary | ICD-10-CM | POA: Diagnosis not present

## 2021-12-04 DIAGNOSIS — E870 Hyperosmolality and hypernatremia: Secondary | ICD-10-CM | POA: Diagnosis not present

## 2021-12-04 DIAGNOSIS — E7849 Other hyperlipidemia: Secondary | ICD-10-CM | POA: Diagnosis not present

## 2021-12-04 DIAGNOSIS — J4520 Mild intermittent asthma, uncomplicated: Secondary | ICD-10-CM | POA: Diagnosis not present

## 2021-12-04 DIAGNOSIS — N183 Chronic kidney disease, stage 3 unspecified: Secondary | ICD-10-CM | POA: Diagnosis not present

## 2021-12-04 DIAGNOSIS — Z6841 Body Mass Index (BMI) 40.0 and over, adult: Secondary | ICD-10-CM | POA: Diagnosis not present

## 2021-12-04 DIAGNOSIS — E1122 Type 2 diabetes mellitus with diabetic chronic kidney disease: Secondary | ICD-10-CM | POA: Diagnosis not present

## 2021-12-25 DIAGNOSIS — H524 Presbyopia: Secondary | ICD-10-CM | POA: Diagnosis not present

## 2022-01-05 DIAGNOSIS — N183 Chronic kidney disease, stage 3 unspecified: Secondary | ICD-10-CM | POA: Diagnosis not present

## 2022-01-05 DIAGNOSIS — E1122 Type 2 diabetes mellitus with diabetic chronic kidney disease: Secondary | ICD-10-CM | POA: Diagnosis not present

## 2022-01-05 DIAGNOSIS — E7849 Other hyperlipidemia: Secondary | ICD-10-CM | POA: Diagnosis not present

## 2022-01-05 DIAGNOSIS — I129 Hypertensive chronic kidney disease with stage 1 through stage 4 chronic kidney disease, or unspecified chronic kidney disease: Secondary | ICD-10-CM | POA: Diagnosis not present

## 2022-01-13 DIAGNOSIS — R609 Edema, unspecified: Secondary | ICD-10-CM | POA: Diagnosis not present

## 2022-01-13 DIAGNOSIS — E7849 Other hyperlipidemia: Secondary | ICD-10-CM | POA: Diagnosis not present

## 2022-01-13 DIAGNOSIS — E1122 Type 2 diabetes mellitus with diabetic chronic kidney disease: Secondary | ICD-10-CM | POA: Diagnosis not present

## 2022-01-13 DIAGNOSIS — N183 Chronic kidney disease, stage 3 unspecified: Secondary | ICD-10-CM | POA: Diagnosis not present

## 2022-01-13 DIAGNOSIS — Z6841 Body Mass Index (BMI) 40.0 and over, adult: Secondary | ICD-10-CM | POA: Diagnosis not present

## 2022-01-13 DIAGNOSIS — R61 Generalized hyperhidrosis: Secondary | ICD-10-CM | POA: Diagnosis not present

## 2022-01-15 DIAGNOSIS — Z0100 Encounter for examination of eyes and vision without abnormal findings: Secondary | ICD-10-CM | POA: Diagnosis not present

## 2022-02-07 DIAGNOSIS — E875 Hyperkalemia: Secondary | ICD-10-CM | POA: Diagnosis not present

## 2022-02-07 DIAGNOSIS — Z1329 Encounter for screening for other suspected endocrine disorder: Secondary | ICD-10-CM | POA: Diagnosis not present

## 2022-02-07 DIAGNOSIS — E1165 Type 2 diabetes mellitus with hyperglycemia: Secondary | ICD-10-CM | POA: Diagnosis not present

## 2022-02-07 DIAGNOSIS — R946 Abnormal results of thyroid function studies: Secondary | ICD-10-CM | POA: Diagnosis not present

## 2022-02-07 DIAGNOSIS — E559 Vitamin D deficiency, unspecified: Secondary | ICD-10-CM | POA: Diagnosis not present

## 2022-02-07 DIAGNOSIS — D649 Anemia, unspecified: Secondary | ICD-10-CM | POA: Diagnosis not present

## 2022-02-07 DIAGNOSIS — E7849 Other hyperlipidemia: Secondary | ICD-10-CM | POA: Diagnosis not present

## 2022-02-07 DIAGNOSIS — E782 Mixed hyperlipidemia: Secondary | ICD-10-CM | POA: Diagnosis not present

## 2022-02-13 DIAGNOSIS — R609 Edema, unspecified: Secondary | ICD-10-CM | POA: Diagnosis not present

## 2022-02-13 DIAGNOSIS — E1122 Type 2 diabetes mellitus with diabetic chronic kidney disease: Secondary | ICD-10-CM | POA: Diagnosis not present

## 2022-02-13 DIAGNOSIS — Z6841 Body Mass Index (BMI) 40.0 and over, adult: Secondary | ICD-10-CM | POA: Diagnosis not present

## 2022-02-13 DIAGNOSIS — E7849 Other hyperlipidemia: Secondary | ICD-10-CM | POA: Diagnosis not present

## 2022-02-13 DIAGNOSIS — N183 Chronic kidney disease, stage 3 unspecified: Secondary | ICD-10-CM | POA: Diagnosis not present

## 2022-03-27 DIAGNOSIS — E1122 Type 2 diabetes mellitus with diabetic chronic kidney disease: Secondary | ICD-10-CM | POA: Diagnosis not present

## 2022-03-27 DIAGNOSIS — R609 Edema, unspecified: Secondary | ICD-10-CM | POA: Diagnosis not present

## 2022-03-27 DIAGNOSIS — N183 Chronic kidney disease, stage 3 unspecified: Secondary | ICD-10-CM | POA: Diagnosis not present

## 2022-03-27 DIAGNOSIS — Z6841 Body Mass Index (BMI) 40.0 and over, adult: Secondary | ICD-10-CM | POA: Diagnosis not present

## 2022-03-27 DIAGNOSIS — E7849 Other hyperlipidemia: Secondary | ICD-10-CM | POA: Diagnosis not present

## 2022-04-07 DIAGNOSIS — E1122 Type 2 diabetes mellitus with diabetic chronic kidney disease: Secondary | ICD-10-CM | POA: Diagnosis not present

## 2022-04-07 DIAGNOSIS — N183 Chronic kidney disease, stage 3 unspecified: Secondary | ICD-10-CM | POA: Diagnosis not present

## 2022-04-07 DIAGNOSIS — E7849 Other hyperlipidemia: Secondary | ICD-10-CM | POA: Diagnosis not present

## 2022-04-07 DIAGNOSIS — I129 Hypertensive chronic kidney disease with stage 1 through stage 4 chronic kidney disease, or unspecified chronic kidney disease: Secondary | ICD-10-CM | POA: Diagnosis not present

## 2022-06-27 DIAGNOSIS — I10 Essential (primary) hypertension: Secondary | ICD-10-CM | POA: Diagnosis not present

## 2022-06-27 DIAGNOSIS — E875 Hyperkalemia: Secondary | ICD-10-CM | POA: Diagnosis not present

## 2022-06-27 DIAGNOSIS — D649 Anemia, unspecified: Secondary | ICD-10-CM | POA: Diagnosis not present

## 2022-06-27 DIAGNOSIS — D529 Folate deficiency anemia, unspecified: Secondary | ICD-10-CM | POA: Diagnosis not present

## 2022-06-27 DIAGNOSIS — N183 Chronic kidney disease, stage 3 unspecified: Secondary | ICD-10-CM | POA: Diagnosis not present

## 2022-06-27 DIAGNOSIS — E119 Type 2 diabetes mellitus without complications: Secondary | ICD-10-CM | POA: Diagnosis not present

## 2022-06-27 DIAGNOSIS — E7849 Other hyperlipidemia: Secondary | ICD-10-CM | POA: Diagnosis not present

## 2022-07-03 DIAGNOSIS — E7849 Other hyperlipidemia: Secondary | ICD-10-CM | POA: Diagnosis not present

## 2022-07-03 DIAGNOSIS — N1832 Chronic kidney disease, stage 3b: Secondary | ICD-10-CM | POA: Diagnosis not present

## 2022-07-03 DIAGNOSIS — I10 Essential (primary) hypertension: Secondary | ICD-10-CM | POA: Diagnosis not present

## 2022-07-03 DIAGNOSIS — E1122 Type 2 diabetes mellitus with diabetic chronic kidney disease: Secondary | ICD-10-CM | POA: Diagnosis not present

## 2022-07-03 DIAGNOSIS — K219 Gastro-esophageal reflux disease without esophagitis: Secondary | ICD-10-CM | POA: Diagnosis not present

## 2022-08-07 DIAGNOSIS — E782 Mixed hyperlipidemia: Secondary | ICD-10-CM | POA: Diagnosis not present

## 2022-08-07 DIAGNOSIS — E1122 Type 2 diabetes mellitus with diabetic chronic kidney disease: Secondary | ICD-10-CM | POA: Diagnosis not present

## 2022-08-07 DIAGNOSIS — I10 Essential (primary) hypertension: Secondary | ICD-10-CM | POA: Diagnosis not present

## 2022-09-30 DIAGNOSIS — E7849 Other hyperlipidemia: Secondary | ICD-10-CM | POA: Diagnosis not present

## 2022-09-30 DIAGNOSIS — E559 Vitamin D deficiency, unspecified: Secondary | ICD-10-CM | POA: Diagnosis not present

## 2022-09-30 DIAGNOSIS — N189 Chronic kidney disease, unspecified: Secondary | ICD-10-CM | POA: Diagnosis not present

## 2022-09-30 DIAGNOSIS — D529 Folate deficiency anemia, unspecified: Secondary | ICD-10-CM | POA: Diagnosis not present

## 2022-09-30 DIAGNOSIS — E875 Hyperkalemia: Secondary | ICD-10-CM | POA: Diagnosis not present

## 2022-09-30 DIAGNOSIS — D519 Vitamin B12 deficiency anemia, unspecified: Secondary | ICD-10-CM | POA: Diagnosis not present

## 2022-09-30 DIAGNOSIS — E039 Hypothyroidism, unspecified: Secondary | ICD-10-CM | POA: Diagnosis not present

## 2022-09-30 DIAGNOSIS — E119 Type 2 diabetes mellitus without complications: Secondary | ICD-10-CM | POA: Diagnosis not present

## 2022-09-30 DIAGNOSIS — I10 Essential (primary) hypertension: Secondary | ICD-10-CM | POA: Diagnosis not present

## 2022-09-30 DIAGNOSIS — D649 Anemia, unspecified: Secondary | ICD-10-CM | POA: Diagnosis not present

## 2022-09-30 DIAGNOSIS — K219 Gastro-esophageal reflux disease without esophagitis: Secondary | ICD-10-CM | POA: Diagnosis not present

## 2022-10-01 DIAGNOSIS — I10 Essential (primary) hypertension: Secondary | ICD-10-CM | POA: Diagnosis not present

## 2022-10-01 DIAGNOSIS — E119 Type 2 diabetes mellitus without complications: Secondary | ICD-10-CM | POA: Diagnosis not present

## 2022-10-01 DIAGNOSIS — K219 Gastro-esophageal reflux disease without esophagitis: Secondary | ICD-10-CM | POA: Diagnosis not present

## 2022-10-01 DIAGNOSIS — N189 Chronic kidney disease, unspecified: Secondary | ICD-10-CM | POA: Diagnosis not present

## 2022-10-01 DIAGNOSIS — E875 Hyperkalemia: Secondary | ICD-10-CM | POA: Diagnosis not present

## 2022-10-01 DIAGNOSIS — E559 Vitamin D deficiency, unspecified: Secondary | ICD-10-CM | POA: Diagnosis not present

## 2022-10-01 DIAGNOSIS — D529 Folate deficiency anemia, unspecified: Secondary | ICD-10-CM | POA: Diagnosis not present

## 2022-10-01 DIAGNOSIS — D519 Vitamin B12 deficiency anemia, unspecified: Secondary | ICD-10-CM | POA: Diagnosis not present

## 2022-10-01 DIAGNOSIS — E782 Mixed hyperlipidemia: Secondary | ICD-10-CM | POA: Diagnosis not present

## 2022-10-01 DIAGNOSIS — D649 Anemia, unspecified: Secondary | ICD-10-CM | POA: Diagnosis not present

## 2022-10-01 DIAGNOSIS — Z1329 Encounter for screening for other suspected endocrine disorder: Secondary | ICD-10-CM | POA: Diagnosis not present

## 2022-10-07 ENCOUNTER — Other Ambulatory Visit (HOSPITAL_COMMUNITY): Payer: Self-pay | Admitting: Family Medicine

## 2022-10-07 DIAGNOSIS — N1832 Chronic kidney disease, stage 3b: Secondary | ICD-10-CM | POA: Diagnosis not present

## 2022-10-07 DIAGNOSIS — Z0001 Encounter for general adult medical examination with abnormal findings: Secondary | ICD-10-CM | POA: Diagnosis not present

## 2022-10-07 DIAGNOSIS — E7849 Other hyperlipidemia: Secondary | ICD-10-CM | POA: Diagnosis not present

## 2022-10-07 DIAGNOSIS — Z6841 Body Mass Index (BMI) 40.0 and over, adult: Secondary | ICD-10-CM | POA: Diagnosis not present

## 2022-10-07 DIAGNOSIS — E1122 Type 2 diabetes mellitus with diabetic chronic kidney disease: Secondary | ICD-10-CM | POA: Diagnosis not present

## 2022-10-07 DIAGNOSIS — I10 Essential (primary) hypertension: Secondary | ICD-10-CM | POA: Diagnosis not present

## 2022-10-07 DIAGNOSIS — Z1231 Encounter for screening mammogram for malignant neoplasm of breast: Secondary | ICD-10-CM

## 2022-10-07 DIAGNOSIS — K219 Gastro-esophageal reflux disease without esophagitis: Secondary | ICD-10-CM | POA: Diagnosis not present

## 2022-10-14 DIAGNOSIS — D649 Anemia, unspecified: Secondary | ICD-10-CM | POA: Diagnosis not present

## 2022-10-14 DIAGNOSIS — E7849 Other hyperlipidemia: Secondary | ICD-10-CM | POA: Diagnosis not present

## 2022-10-14 DIAGNOSIS — I10 Essential (primary) hypertension: Secondary | ICD-10-CM | POA: Diagnosis not present

## 2022-10-14 DIAGNOSIS — Z1212 Encounter for screening for malignant neoplasm of rectum: Secondary | ICD-10-CM | POA: Diagnosis not present

## 2022-10-14 DIAGNOSIS — F17210 Nicotine dependence, cigarettes, uncomplicated: Secondary | ICD-10-CM | POA: Diagnosis not present

## 2022-10-14 DIAGNOSIS — E1165 Type 2 diabetes mellitus with hyperglycemia: Secondary | ICD-10-CM | POA: Diagnosis not present

## 2022-10-14 DIAGNOSIS — Z0001 Encounter for general adult medical examination with abnormal findings: Secondary | ICD-10-CM | POA: Diagnosis not present

## 2022-10-14 DIAGNOSIS — Z1211 Encounter for screening for malignant neoplasm of colon: Secondary | ICD-10-CM | POA: Diagnosis not present

## 2022-10-14 DIAGNOSIS — N1832 Chronic kidney disease, stage 3b: Secondary | ICD-10-CM | POA: Diagnosis not present

## 2022-10-16 ENCOUNTER — Ambulatory Visit (HOSPITAL_COMMUNITY)
Admission: RE | Admit: 2022-10-16 | Discharge: 2022-10-16 | Disposition: A | Discharge status: Home / Self Care | Payer: No Typology Code available for payment source | Source: Ambulatory Visit | Attending: Family Medicine | Admitting: Family Medicine

## 2022-10-16 DIAGNOSIS — Z1231 Encounter for screening mammogram for malignant neoplasm of breast: Secondary | ICD-10-CM | POA: Insufficient documentation

## 2022-10-21 ENCOUNTER — Other Ambulatory Visit (HOSPITAL_COMMUNITY): Payer: Self-pay | Admitting: Family Medicine

## 2022-10-21 DIAGNOSIS — R928 Other abnormal and inconclusive findings on diagnostic imaging of breast: Secondary | ICD-10-CM

## 2022-11-06 ENCOUNTER — Ambulatory Visit (HOSPITAL_COMMUNITY)
Admission: RE | Admit: 2022-11-06 | Discharge: 2022-11-06 | Disposition: A | Discharge status: Home / Self Care | Payer: No Typology Code available for payment source | Source: Ambulatory Visit | Attending: Family Medicine | Admitting: Family Medicine

## 2022-11-06 ENCOUNTER — Encounter (HOSPITAL_COMMUNITY): Payer: Self-pay

## 2022-11-06 DIAGNOSIS — R928 Other abnormal and inconclusive findings on diagnostic imaging of breast: Secondary | ICD-10-CM | POA: Insufficient documentation

## 2022-12-07 DIAGNOSIS — E782 Mixed hyperlipidemia: Secondary | ICD-10-CM | POA: Diagnosis not present

## 2022-12-07 DIAGNOSIS — E1122 Type 2 diabetes mellitus with diabetic chronic kidney disease: Secondary | ICD-10-CM | POA: Diagnosis not present

## 2022-12-07 DIAGNOSIS — I10 Essential (primary) hypertension: Secondary | ICD-10-CM | POA: Diagnosis not present

## 2023-01-02 DIAGNOSIS — E559 Vitamin D deficiency, unspecified: Secondary | ICD-10-CM | POA: Diagnosis not present

## 2023-01-02 DIAGNOSIS — E1122 Type 2 diabetes mellitus with diabetic chronic kidney disease: Secondary | ICD-10-CM | POA: Diagnosis not present

## 2023-01-02 DIAGNOSIS — E1165 Type 2 diabetes mellitus with hyperglycemia: Secondary | ICD-10-CM | POA: Diagnosis not present

## 2023-01-06 DIAGNOSIS — M1712 Unilateral primary osteoarthritis, left knee: Secondary | ICD-10-CM | POA: Diagnosis not present

## 2023-01-06 DIAGNOSIS — I10 Essential (primary) hypertension: Secondary | ICD-10-CM | POA: Diagnosis not present

## 2023-01-06 DIAGNOSIS — K219 Gastro-esophageal reflux disease without esophagitis: Secondary | ICD-10-CM | POA: Diagnosis not present

## 2023-01-06 DIAGNOSIS — E1122 Type 2 diabetes mellitus with diabetic chronic kidney disease: Secondary | ICD-10-CM | POA: Diagnosis not present

## 2023-01-06 DIAGNOSIS — N1832 Chronic kidney disease, stage 3b: Secondary | ICD-10-CM | POA: Diagnosis not present

## 2023-01-06 DIAGNOSIS — E7849 Other hyperlipidemia: Secondary | ICD-10-CM | POA: Diagnosis not present

## 2023-01-06 DIAGNOSIS — Z6841 Body Mass Index (BMI) 40.0 and over, adult: Secondary | ICD-10-CM | POA: Diagnosis not present

## 2023-01-17 DIAGNOSIS — R030 Elevated blood-pressure reading, without diagnosis of hypertension: Secondary | ICD-10-CM | POA: Diagnosis not present

## 2023-01-17 DIAGNOSIS — M109 Gout, unspecified: Secondary | ICD-10-CM | POA: Diagnosis not present

## 2023-01-17 DIAGNOSIS — Z6841 Body Mass Index (BMI) 40.0 and over, adult: Secondary | ICD-10-CM | POA: Diagnosis not present

## 2023-01-19 DIAGNOSIS — M109 Gout, unspecified: Secondary | ICD-10-CM | POA: Diagnosis not present

## 2023-01-27 DIAGNOSIS — H5203 Hypermetropia, bilateral: Secondary | ICD-10-CM | POA: Diagnosis not present

## 2023-02-23 DIAGNOSIS — M19071 Primary osteoarthritis, right ankle and foot: Secondary | ICD-10-CM | POA: Diagnosis not present

## 2023-02-23 DIAGNOSIS — Z6841 Body Mass Index (BMI) 40.0 and over, adult: Secondary | ICD-10-CM | POA: Diagnosis not present

## 2023-02-23 DIAGNOSIS — I10 Essential (primary) hypertension: Secondary | ICD-10-CM | POA: Diagnosis not present

## 2023-02-23 DIAGNOSIS — N1832 Chronic kidney disease, stage 3b: Secondary | ICD-10-CM | POA: Diagnosis not present

## 2023-04-30 DIAGNOSIS — M1711 Unilateral primary osteoarthritis, right knee: Secondary | ICD-10-CM | POA: Diagnosis not present

## 2023-04-30 DIAGNOSIS — Z6841 Body Mass Index (BMI) 40.0 and over, adult: Secondary | ICD-10-CM | POA: Diagnosis not present

## 2023-04-30 DIAGNOSIS — M25561 Pain in right knee: Secondary | ICD-10-CM | POA: Diagnosis not present

## 2023-04-30 DIAGNOSIS — R030 Elevated blood-pressure reading, without diagnosis of hypertension: Secondary | ICD-10-CM | POA: Diagnosis not present

## 2023-05-08 DIAGNOSIS — E559 Vitamin D deficiency, unspecified: Secondary | ICD-10-CM | POA: Diagnosis not present

## 2023-05-08 DIAGNOSIS — M19072 Primary osteoarthritis, left ankle and foot: Secondary | ICD-10-CM | POA: Diagnosis not present

## 2023-05-08 DIAGNOSIS — E1165 Type 2 diabetes mellitus with hyperglycemia: Secondary | ICD-10-CM | POA: Diagnosis not present

## 2023-05-08 DIAGNOSIS — M19071 Primary osteoarthritis, right ankle and foot: Secondary | ICD-10-CM | POA: Diagnosis not present

## 2023-05-22 DIAGNOSIS — E785 Hyperlipidemia, unspecified: Secondary | ICD-10-CM | POA: Diagnosis not present

## 2023-05-22 DIAGNOSIS — I129 Hypertensive chronic kidney disease with stage 1 through stage 4 chronic kidney disease, or unspecified chronic kidney disease: Secondary | ICD-10-CM | POA: Diagnosis not present

## 2023-05-22 DIAGNOSIS — Z008 Encounter for other general examination: Secondary | ICD-10-CM | POA: Diagnosis not present

## 2023-05-22 DIAGNOSIS — M1990 Unspecified osteoarthritis, unspecified site: Secondary | ICD-10-CM | POA: Diagnosis not present

## 2023-05-22 DIAGNOSIS — Z6838 Body mass index (BMI) 38.0-38.9, adult: Secondary | ICD-10-CM | POA: Diagnosis not present

## 2023-05-22 DIAGNOSIS — F17211 Nicotine dependence, cigarettes, in remission: Secondary | ICD-10-CM | POA: Diagnosis not present

## 2023-05-22 DIAGNOSIS — N1832 Chronic kidney disease, stage 3b: Secondary | ICD-10-CM | POA: Diagnosis not present

## 2023-07-01 DIAGNOSIS — N183 Chronic kidney disease, stage 3 unspecified: Secondary | ICD-10-CM | POA: Diagnosis not present

## 2023-07-01 DIAGNOSIS — E1165 Type 2 diabetes mellitus with hyperglycemia: Secondary | ICD-10-CM | POA: Diagnosis not present

## 2023-07-01 DIAGNOSIS — E870 Hyperosmolality and hypernatremia: Secondary | ICD-10-CM | POA: Diagnosis not present

## 2023-07-01 DIAGNOSIS — E1122 Type 2 diabetes mellitus with diabetic chronic kidney disease: Secondary | ICD-10-CM | POA: Diagnosis not present

## 2023-07-01 DIAGNOSIS — E7849 Other hyperlipidemia: Secondary | ICD-10-CM | POA: Diagnosis not present

## 2023-07-02 DIAGNOSIS — E875 Hyperkalemia: Secondary | ICD-10-CM | POA: Diagnosis not present

## 2023-07-02 DIAGNOSIS — I10 Essential (primary) hypertension: Secondary | ICD-10-CM | POA: Diagnosis not present

## 2023-07-02 DIAGNOSIS — E119 Type 2 diabetes mellitus without complications: Secondary | ICD-10-CM | POA: Diagnosis not present

## 2023-07-02 DIAGNOSIS — D649 Anemia, unspecified: Secondary | ICD-10-CM | POA: Diagnosis not present

## 2023-07-02 DIAGNOSIS — N1832 Chronic kidney disease, stage 3b: Secondary | ICD-10-CM | POA: Diagnosis not present

## 2023-07-08 DIAGNOSIS — I10 Essential (primary) hypertension: Secondary | ICD-10-CM | POA: Diagnosis not present

## 2023-07-08 DIAGNOSIS — M1711 Unilateral primary osteoarthritis, right knee: Secondary | ICD-10-CM | POA: Diagnosis not present

## 2023-07-08 DIAGNOSIS — M1712 Unilateral primary osteoarthritis, left knee: Secondary | ICD-10-CM | POA: Diagnosis not present

## 2023-07-08 DIAGNOSIS — E7849 Other hyperlipidemia: Secondary | ICD-10-CM | POA: Diagnosis not present

## 2023-07-08 DIAGNOSIS — N1832 Chronic kidney disease, stage 3b: Secondary | ICD-10-CM | POA: Diagnosis not present

## 2023-07-08 DIAGNOSIS — E1122 Type 2 diabetes mellitus with diabetic chronic kidney disease: Secondary | ICD-10-CM | POA: Diagnosis not present

## 2023-07-08 DIAGNOSIS — Z6841 Body Mass Index (BMI) 40.0 and over, adult: Secondary | ICD-10-CM | POA: Diagnosis not present

## 2023-12-07 DIAGNOSIS — E1169 Type 2 diabetes mellitus with other specified complication: Secondary | ICD-10-CM | POA: Diagnosis not present

## 2023-12-07 DIAGNOSIS — E1122 Type 2 diabetes mellitus with diabetic chronic kidney disease: Secondary | ICD-10-CM | POA: Diagnosis not present

## 2023-12-07 DIAGNOSIS — Z6838 Body mass index (BMI) 38.0-38.9, adult: Secondary | ICD-10-CM | POA: Diagnosis not present

## 2023-12-07 DIAGNOSIS — E785 Hyperlipidemia, unspecified: Secondary | ICD-10-CM | POA: Diagnosis not present

## 2023-12-07 DIAGNOSIS — J4520 Mild intermittent asthma, uncomplicated: Secondary | ICD-10-CM | POA: Diagnosis not present

## 2023-12-07 DIAGNOSIS — I129 Hypertensive chronic kidney disease with stage 1 through stage 4 chronic kidney disease, or unspecified chronic kidney disease: Secondary | ICD-10-CM | POA: Diagnosis not present

## 2023-12-07 DIAGNOSIS — Z008 Encounter for other general examination: Secondary | ICD-10-CM | POA: Diagnosis not present

## 2023-12-07 DIAGNOSIS — N1832 Chronic kidney disease, stage 3b: Secondary | ICD-10-CM | POA: Diagnosis not present

## 2023-12-16 DIAGNOSIS — E1122 Type 2 diabetes mellitus with diabetic chronic kidney disease: Secondary | ICD-10-CM | POA: Diagnosis not present

## 2023-12-16 DIAGNOSIS — Z1329 Encounter for screening for other suspected endocrine disorder: Secondary | ICD-10-CM | POA: Diagnosis not present

## 2023-12-16 DIAGNOSIS — N183 Chronic kidney disease, stage 3 unspecified: Secondary | ICD-10-CM | POA: Diagnosis not present

## 2023-12-16 DIAGNOSIS — E7849 Other hyperlipidemia: Secondary | ICD-10-CM | POA: Diagnosis not present

## 2023-12-16 DIAGNOSIS — N182 Chronic kidney disease, stage 2 (mild): Secondary | ICD-10-CM | POA: Diagnosis not present

## 2023-12-16 DIAGNOSIS — E782 Mixed hyperlipidemia: Secondary | ICD-10-CM | POA: Diagnosis not present

## 2023-12-23 ENCOUNTER — Other Ambulatory Visit (HOSPITAL_COMMUNITY): Payer: Self-pay | Admitting: Family Medicine

## 2023-12-23 DIAGNOSIS — Z0000 Encounter for general adult medical examination without abnormal findings: Secondary | ICD-10-CM | POA: Diagnosis not present

## 2023-12-23 DIAGNOSIS — Z0001 Encounter for general adult medical examination with abnormal findings: Secondary | ICD-10-CM | POA: Diagnosis not present

## 2023-12-23 DIAGNOSIS — E1122 Type 2 diabetes mellitus with diabetic chronic kidney disease: Secondary | ICD-10-CM | POA: Diagnosis not present

## 2023-12-23 DIAGNOSIS — M1711 Unilateral primary osteoarthritis, right knee: Secondary | ICD-10-CM | POA: Diagnosis not present

## 2023-12-23 DIAGNOSIS — I10 Essential (primary) hypertension: Secondary | ICD-10-CM | POA: Diagnosis not present

## 2023-12-23 DIAGNOSIS — Z1231 Encounter for screening mammogram for malignant neoplasm of breast: Secondary | ICD-10-CM

## 2023-12-23 DIAGNOSIS — Z6841 Body Mass Index (BMI) 40.0 and over, adult: Secondary | ICD-10-CM | POA: Diagnosis not present

## 2023-12-23 DIAGNOSIS — Z1331 Encounter for screening for depression: Secondary | ICD-10-CM | POA: Diagnosis not present

## 2023-12-23 DIAGNOSIS — Z1389 Encounter for screening for other disorder: Secondary | ICD-10-CM | POA: Diagnosis not present

## 2024-01-28 DIAGNOSIS — H5203 Hypermetropia, bilateral: Secondary | ICD-10-CM | POA: Diagnosis not present

## 2024-02-09 DIAGNOSIS — E1122 Type 2 diabetes mellitus with diabetic chronic kidney disease: Secondary | ICD-10-CM | POA: Diagnosis not present

## 2024-02-09 DIAGNOSIS — M25819 Other specified joint disorders, unspecified shoulder: Secondary | ICD-10-CM | POA: Diagnosis not present

## 2024-02-09 DIAGNOSIS — M7502 Adhesive capsulitis of left shoulder: Secondary | ICD-10-CM | POA: Diagnosis not present

## 2024-02-09 DIAGNOSIS — Z6841 Body Mass Index (BMI) 40.0 and over, adult: Secondary | ICD-10-CM | POA: Diagnosis not present

## 2024-02-09 DIAGNOSIS — M19012 Primary osteoarthritis, left shoulder: Secondary | ICD-10-CM | POA: Diagnosis not present

## 2024-03-08 DIAGNOSIS — Z6841 Body Mass Index (BMI) 40.0 and over, adult: Secondary | ICD-10-CM | POA: Diagnosis not present

## 2024-03-08 DIAGNOSIS — M25572 Pain in left ankle and joints of left foot: Secondary | ICD-10-CM | POA: Diagnosis not present

## 2024-04-19 DIAGNOSIS — Z6839 Body mass index (BMI) 39.0-39.9, adult: Secondary | ICD-10-CM | POA: Diagnosis not present

## 2024-04-19 DIAGNOSIS — L209 Atopic dermatitis, unspecified: Secondary | ICD-10-CM | POA: Diagnosis not present

## 2024-04-19 DIAGNOSIS — L299 Pruritus, unspecified: Secondary | ICD-10-CM | POA: Diagnosis not present

## 2024-05-12 DIAGNOSIS — L209 Atopic dermatitis, unspecified: Secondary | ICD-10-CM | POA: Diagnosis not present

## 2024-05-12 DIAGNOSIS — E1122 Type 2 diabetes mellitus with diabetic chronic kidney disease: Secondary | ICD-10-CM | POA: Diagnosis not present

## 2024-05-12 DIAGNOSIS — I10 Essential (primary) hypertension: Secondary | ICD-10-CM | POA: Diagnosis not present

## 2024-05-12 DIAGNOSIS — Z6839 Body mass index (BMI) 39.0-39.9, adult: Secondary | ICD-10-CM | POA: Diagnosis not present

## 2024-05-12 DIAGNOSIS — L299 Pruritus, unspecified: Secondary | ICD-10-CM | POA: Diagnosis not present

## 2024-06-16 DIAGNOSIS — E1122 Type 2 diabetes mellitus with diabetic chronic kidney disease: Secondary | ICD-10-CM | POA: Diagnosis not present

## 2024-06-16 DIAGNOSIS — N1832 Chronic kidney disease, stage 3b: Secondary | ICD-10-CM | POA: Diagnosis not present

## 2024-06-16 DIAGNOSIS — E7849 Other hyperlipidemia: Secondary | ICD-10-CM | POA: Diagnosis not present

## 2024-06-16 DIAGNOSIS — E559 Vitamin D deficiency, unspecified: Secondary | ICD-10-CM | POA: Diagnosis not present

## 2024-06-23 DIAGNOSIS — E782 Mixed hyperlipidemia: Secondary | ICD-10-CM | POA: Diagnosis not present

## 2024-06-23 DIAGNOSIS — Z6838 Body mass index (BMI) 38.0-38.9, adult: Secondary | ICD-10-CM | POA: Diagnosis not present

## 2024-06-23 DIAGNOSIS — R7989 Other specified abnormal findings of blood chemistry: Secondary | ICD-10-CM | POA: Diagnosis not present

## 2024-06-23 DIAGNOSIS — E7849 Other hyperlipidemia: Secondary | ICD-10-CM | POA: Diagnosis not present

## 2024-06-23 DIAGNOSIS — E1122 Type 2 diabetes mellitus with diabetic chronic kidney disease: Secondary | ICD-10-CM | POA: Diagnosis not present

## 2024-06-28 ENCOUNTER — Other Ambulatory Visit: Payer: Self-pay | Admitting: Family Medicine

## 2024-06-28 DIAGNOSIS — R7989 Other specified abnormal findings of blood chemistry: Secondary | ICD-10-CM

## 2024-07-06 ENCOUNTER — Ambulatory Visit
Admission: RE | Admit: 2024-07-06 | Discharge: 2024-07-06 | Disposition: A | Source: Ambulatory Visit | Attending: Family Medicine | Admitting: Family Medicine

## 2024-07-06 DIAGNOSIS — R7989 Other specified abnormal findings of blood chemistry: Secondary | ICD-10-CM
